# Patient Record
Sex: Male | Born: 1950 | Race: White | Hispanic: No | Marital: Single | Smoking: Former smoker
Health system: Southern US, Community
[De-identification: ages and names within clinical notes are randomized; demographics above are authoritative.]

## PROBLEM LIST (undated history)

## (undated) DIAGNOSIS — J432 Centrilobular emphysema: Secondary | ICD-10-CM

## (undated) DIAGNOSIS — Z87891 Personal history of nicotine dependence: Secondary | ICD-10-CM

## (undated) DIAGNOSIS — Z87442 Personal history of urinary calculi: Secondary | ICD-10-CM

## (undated) DIAGNOSIS — F419 Anxiety disorder, unspecified: Secondary | ICD-10-CM

## (undated) DIAGNOSIS — J449 Chronic obstructive pulmonary disease, unspecified: Secondary | ICD-10-CM

## (undated) HISTORY — PX: DENTAL SURGERY: SHX609

## (undated) HISTORY — DX: Personal history of nicotine dependence: Z87.891

---

## 2006-05-19 ENCOUNTER — Emergency Department (HOSPITAL_COMMUNITY): Admission: EM | Admit: 2006-05-19 | Discharge: 2006-05-19 | Payer: Self-pay | Admitting: Emergency Medicine

## 2012-02-15 ENCOUNTER — Encounter (HOSPITAL_COMMUNITY): Payer: Self-pay | Admitting: Emergency Medicine

## 2012-02-15 ENCOUNTER — Emergency Department (HOSPITAL_COMMUNITY): Payer: Self-pay

## 2012-02-15 ENCOUNTER — Emergency Department (HOSPITAL_COMMUNITY)
Admission: EM | Admit: 2012-02-15 | Discharge: 2012-02-15 | Disposition: A | Discharge status: Home / Self Care | Payer: Self-pay | Attending: Emergency Medicine | Admitting: Emergency Medicine

## 2012-02-15 DIAGNOSIS — N201 Calculus of ureter: Secondary | ICD-10-CM | POA: Insufficient documentation

## 2012-02-15 DIAGNOSIS — R109 Unspecified abdominal pain: Secondary | ICD-10-CM | POA: Insufficient documentation

## 2012-02-15 LAB — URINE MICROSCOPIC-ADD ON

## 2012-02-15 LAB — URINALYSIS, ROUTINE W REFLEX MICROSCOPIC
Bilirubin Urine: NEGATIVE
Glucose, UA: NEGATIVE mg/dL
Ketones, ur: NEGATIVE mg/dL
Leukocytes, UA: NEGATIVE
Nitrite: NEGATIVE
Protein, ur: NEGATIVE mg/dL
Specific Gravity, Urine: 1.026 (ref 1.005–1.030)
Urobilinogen, UA: 0.2 mg/dL (ref 0.0–1.0)
pH: 5.5 (ref 5.0–8.0)

## 2012-02-15 NOTE — ED Provider Notes (Signed)
History     CSN: [id number]  Arrival date & time 02/15/12  7829   First MD Initiated Contact with Patient 02/15/12 [phone]      Chief Complaint  Patient presents with  . Flank Pain    (Consider location/radiation/quality/duration/timing/severity/associated sxs/prior treatment) Patient is a 61 y.o. male presenting with flank pain. The history is provided by the patient.  Flank Pain  He was awakened at 12:30 AM by left mid abdominal pain which was dull and crampy. Pain was moderately severe and he rated it an 8/10. He went to the bathroom and noted that he was having difficulty urinating. There is no dysuria but had a sense that he has to go is difficult to get the stream started. He denies nausea, vomiting, fever, chills. He had a kidney stone 6 years ago and this pain is similar but not as severe. He took over-the-counter ibuprofen which has given him considerable relief in pain is now rated at 5/10.  History reviewed. No pertinent past medical history.  History reviewed. No pertinent past surgical history.  History reviewed. No pertinent family history.  History  Substance Use Topics  . Smoking status: Never Smoker   . Smokeless tobacco: Not on file  . Alcohol Use: No      Review of Systems  Genitourinary: Positive for flank pain.  All other systems reviewed and are negative.    Allergies  Review of patient's allergies indicates no known allergies.  Home Medications   Current Outpatient Rx  Name Route Sig Dispense Refill  . IBUPROFEN 200 MG PO TABS Oral Take 200 mg by mouth every 6 (six) hours as needed. For pain      BP 157/90  Pulse 69  Temp 97.6 F (36.4 C) (Oral)  Resp 18  Ht 6\' 4"  (1.93 m)  Wt 215 lb (97.523 kg)  BMI 26.17 kg/m2  SpO2 97%  Physical Exam  Nursing note and vitals reviewed.  61 year old male who is resting comfortably and in no acute distress. Vital signs are significant for mild hypertension with blood pressure 157/90. Oxygen saturation  is 97% which is normal. Head is normocephalic and atraumatic. PERRLA, EOMI. Neck is nontender and supple. Back is nontender. Lungs are clear without rales, wheezes, rhonchi. Heart has regular rate rhythm without murmur. Abdomen is soft, flat, nontender without masses or hepatosplenomegaly. There is no CVA tenderness. Extremities have full range of motion, no cyanosis or edema. Skin is warm and dry without rash. Neurologic: Mental status is normal, cranial nerves are intact, there are no motor or sensory deficits.  ED Course  Procedures (including critical care time)  Results for orders placed during the hospital encounter of 02/15/12  URINALYSIS, ROUTINE W REFLEX MICROSCOPIC      Component Value Range   Color, Urine YELLOW  YELLOW   APPearance CLEAR  CLEAR   Specific Gravity, Urine 1.026  1.005 - 1.030   pH 5.5  5.0 - 8.0   Glucose, UA NEGATIVE  NEGATIVE mg/dL   Hgb urine dipstick MODERATE (*) NEGATIVE   Bilirubin Urine NEGATIVE  NEGATIVE   Ketones, ur NEGATIVE  NEGATIVE mg/dL   Protein, ur NEGATIVE  NEGATIVE mg/dL   Urobilinogen, UA 0.2  0.0 - 1.0 mg/dL   Nitrite NEGATIVE  NEGATIVE   Leukocytes, UA NEGATIVE  NEGATIVE  URINE MICROSCOPIC-ADD ON      Component Value Range   Squamous Epithelial / LPF RARE  RARE   WBC, UA 0-2  <3 WBC/hpf  RBC / HPF 11-20  <3 RBC/hpf   Ct Abdomen Pelvis Wo Contrast  02/15/2012  *RADIOLOGY REPORT*  Clinical Data: Intermittent left flank pain.  CT ABDOMEN AND PELVIS WITHOUT CONTRAST  Technique:  Multidetector CT imaging of the abdomen and pelvis was performed following the standard protocol without intravenous contrast.  Comparison: 05/19/2006.  Findings: Within the bladder, there is a 4 mm dependent stone. Fullness of the left renal collecting system may be related to this recently passed stone.  Evaluation of solid abdominal viscera is limited by lack of IV contrast.  Taking this limitation into account no focal hepatic, splenic, pancreatic, adrenal or renal  lesion.  No calcified gallstones.  No abdominal aortic aneurysm.  No bowel inflammatory process or free intraperitoneal air. Appendix not clearly visualized.  Scarring/atelectasis lung bases.  Mild degenerative changes lumbar spine without bony destructive lesion.  Noncontrast filled views of the urinary bladder unremarkable with exception of the contained stone.  Prostate gland slightly lobulated.  Interaortocaval lymph node measures 3.6 x 1.7 cm.  This is without change.  Fatty containing left inguinal hernia.  IMPRESSION: Within the bladder, there is a 4 mm dependent stone.  Fullness of the left renal collecting system may be related to this recently passed stone.  Please see above.  Original Report Authenticated By: Fuller Canada, M.D.      1. Ureterolithiasis       MDM  Abdominal pain suspicious for ureteral colic. Urinalysis does show some hematuria. I reviewed his prior ED record and CT scan from 2007 and there were punctate stones present in the kidneys at bedtime in addition to the 4 mm distal right ureteral calculus which he did pass. He will be sent for CT scan to evaluate whether he has another ureteral calculus.   CT scan shows calculus has passed into the bladder. Patient is symptom-free and will be discharged.     Dione Booze, MD 02/15/12 1150

## 2012-02-15 NOTE — ED Notes (Signed)
Pt c/o intermittent left flank pain since 1230 this am. Pt states the pain comes and goes but was severe enough to wake him up.

## 2012-02-15 NOTE — ED Notes (Signed)
Pt refuses IV.   

## 2012-02-15 NOTE — Discharge Instructions (Signed)
Kidney Stones Kidney stones (ureteral lithiasis) are deposits that form inside your kidneys. The intense pain is caused by the stone moving through the urinary tract. When the stone moves, the ureter goes into spasm around the stone. The stone is usually passed in the urine.  CAUSES   A disorder that makes certain neck glands produce too much parathyroid hormone (primary hyperparathyroidism).   A buildup of uric acid crystals.   Narrowing (stricture) of the ureter.   A kidney obstruction present at birth (congenital obstruction).   Previous surgery on the kidney or ureters.   Numerous kidney infections.  SYMPTOMS   Feeling sick to your stomach (nauseous).   Throwing up (vomiting).   Blood in the urine (hematuria).   Pain that usually spreads (radiates) to the groin.   Frequency or urgency of urination.  DIAGNOSIS   Taking a history and physical exam.   Blood or urine tests.   Computerized X-ray scan (CT scan).   Occasionally, an examination of the inside of the urinary bladder (cystoscopy) is performed.  TREATMENT   Observation.   Increasing your fluid intake.   Surgery may be needed if you have severe pain or persistent obstruction.  The size, location, and chemical composition are all important variables that will determine the proper choice of action for you. Talk to your caregiver to better understand your situation so that you will minimize the risk of injury to yourself and your kidney.  HOME CARE INSTRUCTIONS   Drink enough water and fluids to keep your urine clear or pale yellow.   Strain all urine through the provided strainer. Keep all particulate matter and stones for your caregiver to see. The stone causing the pain may be as small as a grain of salt. It is very important to use the strainer each and every time you pass your urine. The collection of your stone will allow your caregiver to analyze it and verify that a stone has actually passed.   Only take  over-the-counter or prescription medicines for pain, discomfort, or fever as directed by your caregiver.   Make a follow-up appointment with your caregiver as directed.   Get follow-up X-rays if required. The absence of pain does not always mean that the stone has passed. It may have only stopped moving. If the urine remains completely obstructed, it can cause loss of kidney function or even complete destruction of the kidney. It is your responsibility to make sure X-rays and follow-ups are completed. Ultrasounds of the kidney can show blockages and the status of the kidney. Ultrasounds are not associated with any radiation and can be performed easily in a matter of minutes.  SEEK IMMEDIATE MEDICAL CARE IF:   Pain cannot be controlled with the prescribed medicine.   You have a fever.   The severity or intensity of pain increases over 18 hours and is not relieved by pain medicine.   You develop a new onset of abdominal pain.   You feel faint or pass out.  MAKE SURE YOU:   Understand these instructions.   Will watch your condition.   Will get help right away if you are not doing well or get worse.  Document Released: 08/18/2005 Document Revised: 08/07/2011 Document Reviewed: 12/14/2009 [facility] Patient Information 2012 [place], [place].  Smoking Hazards Smoking cigarettes is extremely bad for your health. Tobacco smoke has over 200 known poisons in it. There are over 60 chemicals in tobacco smoke that cause cancer. Some of the chemicals found in cigarette  smoke include:   Cyanide.   Benzene.   Formaldehyde.   Methanol (wood alcohol).   Acetylene (fuel used in welding torches).   Ammonia.  Cigarette smoke also contains the poisonous gases nitrogen oxide and carbon monoxide.  Cigarette smokers have an increased risk of many serious medical problems, including:  Lung cancer.   Lung disease (such as pneumonia, bronchitis, and emphysema).   Heart attack and chest pain due to  the heart not getting enough oxygen (angina).   Heart disease and peripheral blood vessel disease.   Hypertension.   Stroke.   Oral cancer (cancer of the lip, mouth, or voice box).   Bladder cancer.   Pancreatic cancer.   Cervical cancer.   Pregnancy complications, including premature birth.   Low birthweight babies.   Early menopause.   Lower estrogen level for women.   Infertility.   Facial wrinkles.   Blindness.   Increased risk of broken bones (fractures).   Senile dementia.   Stillbirths and smaller newborn babies, birth defects, and genetic damage to sperm.   Stomach ulcers and internal bleeding.  Children of smokers have an increased risk of the following, because of secondhand smoke exposure:   Sudden infant death syndrome (SIDS).   Respiratory infections.   Lung cancer.   Heart disease.   Ear infections.  Smoking causes approximately:  90% of all lung cancer deaths in men.   80% of all lung cancer deaths in women.   90% of deaths from chronic obstructive lung disease.  Compared with nonsmokers, smoking increases the risk of:  Coronary heart disease by 2 to 4 times.   Stroke by 2 to 4 times.   Men developing lung cancer by 23 times.   Women developing lung cancer by 13 times.   Dying from chronic obstructive lung diseases by 12 times.  Someone who smokes 2 packs a day loses about 8 years of his or her life. Even smoking lightly shortens your life expectancy by several years. You can greatly reduce the risk of medical problems for you and your family by stopping now. Smoking is the most preventable cause of death and disease in our society. Within days of quitting smoking, your circulation returns to normal, you decrease the risk of having a heart attack, and your lung capacity improves. There may be some increased phlegm in the first few days after quitting, and it may take months for your lungs to clear up completely. Quitting for 10 years  cuts your lung cancer risk to almost that of a nonsmoker. WHY IS SMOKING ADDICTIVE?  Nicotine is the chemical agent in tobacco that is capable of causing addiction or dependence.   When you smoke and inhale, nicotine is absorbed rapidly into the bloodstream through your lungs. Nicotine absorbed through the lungs is capable of creating a powerful addiction. Both inhaled and non-inhaled nicotine may be addictive.   Addiction studies of cigarettes and spit tobacco show that addiction to nicotine occurs mainly during the teen years, when young people begin using tobacco products.  WHAT ARE THE BENEFITS OF QUITTING?  There are many health benefits to quitting smoking.   Likelihood of developing cancer and heart disease decreases. Health improvements are seen almost immediately.   Blood pressure, pulse rate, and breathing patterns start returning to normal soon after quitting.   People who quit may see an improvement in their overall quality of life.  Some people choose to quit all at once. Other options include nicotine replacement products, such  as patches, gum, and nasal sprays. Do not use these products without first checking with your caregiver. QUITTING SMOKING It is not easy to quit smoking. Nicotine is addicting, and longtime habits are hard to change. To start, you can write down all your reasons for quitting, tell your family and friends you want to quit, and ask for their help. Throw your cigarettes away, chew gum or cinnamon sticks, keep your hands busy, and drink extra water or juice. Go for walks and practice deep breathing to relax. Think of all the money you are saving: around $1,000 a year, for the average pack-a-day smoker. Nicotine patches and gum have been shown to improve success at efforts to stop smoking. Zyban (bupropion) is an anti-depressant drug that can be prescribed to reduce nicotine withdrawal symptoms and to suppress the urge to smoke. Smoking is an addiction with both  physical and psychological effects. Joining a stop-smoking support group can help you cope with the emotional issues. For more information and advice on programs to stop smoking, call your doctor, your local hospital, or these organizations:  American Lung Association - 1-800-LUNGUSA   American Cancer Society - 1-800-ACS-2345  Document Released: 09/25/2004 Document Revised: 08/07/2011 Document Reviewed: 05/30/2009 [facility] Patient Information 2012 [place], [place].

## 2014-06-02 ENCOUNTER — Other Ambulatory Visit: Payer: Self-pay | Admitting: Cardiology

## 2014-06-02 DIAGNOSIS — E785 Hyperlipidemia, unspecified: Secondary | ICD-10-CM

## 2014-06-02 DIAGNOSIS — R0789 Other chest pain: Secondary | ICD-10-CM

## 2014-06-09 ENCOUNTER — Other Ambulatory Visit: Payer: Self-pay

## 2014-06-16 ENCOUNTER — Other Ambulatory Visit: Payer: Self-pay

## 2016-06-12 ENCOUNTER — Telehealth: Payer: Self-pay | Admitting: Acute Care

## 2016-06-12 DIAGNOSIS — Z87891 Personal history of nicotine dependence: Secondary | ICD-10-CM

## 2016-06-13 NOTE — Telephone Encounter (Signed)
Called spoke with pt. Scheduled SDMV for 06/30/16 at 9:30am. CT order placed. Pt voiced understanding and had no further questions. Nothing further needed.

## 2016-06-13 NOTE — Telephone Encounter (Signed)
Patient called returning Ashtyn's call to schedule -pr

## 2016-06-30 ENCOUNTER — Ambulatory Visit (INDEPENDENT_AMBULATORY_CARE_PROVIDER_SITE_OTHER): Payer: Commercial Managed Care - HMO | Admitting: Acute Care

## 2016-06-30 ENCOUNTER — Encounter: Payer: Self-pay | Admitting: Acute Care

## 2016-06-30 ENCOUNTER — Ambulatory Visit (INDEPENDENT_AMBULATORY_CARE_PROVIDER_SITE_OTHER)
Admission: RE | Admit: 2016-06-30 | Discharge: 2016-06-30 | Disposition: A | Discharge status: Home / Self Care | Payer: Commercial Managed Care - HMO | Source: Ambulatory Visit | Attending: Acute Care | Admitting: Acute Care

## 2016-06-30 DIAGNOSIS — Z87891 Personal history of nicotine dependence: Secondary | ICD-10-CM

## 2016-06-30 NOTE — Progress Notes (Signed)
Shared Decision Making Visit Lung Cancer Screening Program [phone])   Eligibility:  Age 65 y.o.  Pack Years Smoking History Calculation 45 pack year (# packs/per year x # years smoked)  Recent History of coughing up blood  no  Unexplained weight loss? no ( >Than 15 pounds within the last 6 months )  Prior History Lung / other cancer no (Diagnosis within the last 5 years already requiring surveillance chest CT Scans).  Smoking Status Former Smoker  Former Smokers: Years since quit: < 1 year  Quit Date: 03/01/2016  Visit Components:  Discussion included one or more decision making aids. yes  Discussion included risk/benefits of screening. yes  Discussion included potential follow up diagnostic testing for abnormal scans. yes  Discussion included meaning and risk of over diagnosis. yes  Discussion included meaning and risk of False Positives. yes  Discussion included meaning of total radiation exposure. yes  Counseling Included:  Importance of adherence to annual lung cancer LDCT screening. yes  Impact of comorbidities on ability to participate in the program. yes  Ability and willingness to under diagnostic treatment. yes  Smoking Cessation Counseling:  Current Smokers:   Discussed importance of smoking cessation. NA Former smoker  Information about tobacco cessation classes and interventions provided to patient. yes  Patient provided with "ticket" for LDCT Scan. yes  Symptomatic Patient. no  Counseling NA  Diagnosis Code: Tobacco Use Z72.0  Asymptomatic Patient yes  Counseling NA Former smoker  Former Smokers:   Discussed the importance of maintaining cigarette abstinence. yes  Diagnosis Code: Personal History of Nicotine Dependence. P95.093  Information about tobacco cessation classes and interventions provided to patient. Yes  Patient provided with "ticket" for LDCT Scan. yes  Written Order for Lung Cancer Screening with LDCT placed in Epic.  Yes (CT Chest Lung Cancer Screening Low Dose W/O CM) [id number] Z12.2-Screening of respiratory organs Z87.891-Personal history of nicotine dependence  I spent 20 minutes of face to face time with Mr. Paynter discussing the risks and benefits of lung cancer screening. We viewed a power point together that explained in detail the above noted topics. We took the time to pause the power point at intervals to allow for questions to be asked and answered to ensure understanding. We discussed that he had taken the single most powerful action possible to decrease his risk of developing lung cancer when he quit smoking. I counseled him to remain smoke free, and to contact me if he ever had the desire to smoke again so that I can provide resources and tools to help support the effort to remain smoke free. We discussed the time and location of the scan, and that either [place] or I will call with the results within  24-48 hours of receiving them. He has my card and contact information in the event he needs to speak with me, in addition to a copy of the power point we reviewed as a resource. He verbalized understanding of all of the above and had no further questions upon leaving the office.    Magdalen Spatz, NP 06/30/2016

## 2016-07-01 ENCOUNTER — Telehealth: Payer: Self-pay | Admitting: Acute Care

## 2016-07-01 NOTE — Telephone Encounter (Signed)
Pt is calling for CT results from yesterday.  SG please advise. thanks

## 2016-07-01 NOTE — Telephone Encounter (Signed)
Patient calling back regarding results - pt can be reached at [phone]

## 2016-07-01 NOTE — Telephone Encounter (Signed)
Pt requesting CT results.  SG please advise. Thanks.

## 2016-07-01 NOTE — Telephone Encounter (Signed)
These rtesults have been called to the patient who verbalized understanding. I explained that his scan was read as a Lung RADS 1, negative study: no nodules or definitely benign nodules. Radiology recommendation is for a repeat LDCT in 12 months. I told him we will order and schedule the scan for Oct. 2018. I also told him that his scan indicated aortic atherosclerosis. He is currently not on a statin, but does get annual cholesterol monitoring per his PCP. I will send her the scan results. Isaac Carrillo verbalized understanding of the above.

## 2016-07-02 ENCOUNTER — Other Ambulatory Visit: Payer: Self-pay | Admitting: Acute Care

## 2016-07-02 DIAGNOSIS — Z87891 Personal history of nicotine dependence: Secondary | ICD-10-CM

## 2017-07-10 ENCOUNTER — Ambulatory Visit (INDEPENDENT_AMBULATORY_CARE_PROVIDER_SITE_OTHER)
Admission: RE | Admit: 2017-07-10 | Discharge: 2017-07-10 | Disposition: A | Discharge status: Home / Self Care | Payer: 59 | Source: Ambulatory Visit | Attending: Acute Care | Admitting: Acute Care

## 2017-07-10 DIAGNOSIS — Z87891 Personal history of nicotine dependence: Secondary | ICD-10-CM | POA: Diagnosis not present

## 2017-07-17 ENCOUNTER — Other Ambulatory Visit: Payer: Self-pay | Admitting: Acute Care

## 2017-07-17 DIAGNOSIS — Z122 Encounter for screening for malignant neoplasm of respiratory organs: Secondary | ICD-10-CM

## 2017-07-17 DIAGNOSIS — Z87891 Personal history of nicotine dependence: Secondary | ICD-10-CM

## 2017-08-07 ENCOUNTER — Emergency Department (HOSPITAL_COMMUNITY)
Admission: EM | Admit: 2017-08-07 | Discharge: 2017-08-07 | Disposition: A | Discharge status: Home / Self Care | Payer: Medicare HMO | Attending: Emergency Medicine | Admitting: Emergency Medicine

## 2017-08-07 ENCOUNTER — Other Ambulatory Visit: Payer: Self-pay

## 2017-08-07 ENCOUNTER — Encounter (HOSPITAL_COMMUNITY): Payer: Self-pay | Admitting: Emergency Medicine

## 2017-08-07 DIAGNOSIS — Y998 Other external cause status: Secondary | ICD-10-CM | POA: Insufficient documentation

## 2017-08-07 DIAGNOSIS — Y93H9 Activity, other involving exterior property and land maintenance, building and construction: Secondary | ICD-10-CM | POA: Insufficient documentation

## 2017-08-07 DIAGNOSIS — Z87891 Personal history of nicotine dependence: Secondary | ICD-10-CM | POA: Insufficient documentation

## 2017-08-07 DIAGNOSIS — R51 Headache: Secondary | ICD-10-CM | POA: Diagnosis not present

## 2017-08-07 DIAGNOSIS — S0101XA Laceration without foreign body of scalp, initial encounter: Secondary | ICD-10-CM | POA: Diagnosis not present

## 2017-08-07 DIAGNOSIS — W208XXA Other cause of strike by thrown, projected or falling object, initial encounter: Secondary | ICD-10-CM | POA: Diagnosis not present

## 2017-08-07 DIAGNOSIS — Y929 Unspecified place or not applicable: Secondary | ICD-10-CM | POA: Insufficient documentation

## 2017-08-07 DIAGNOSIS — S0121XA Laceration without foreign body of nose, initial encounter: Secondary | ICD-10-CM | POA: Diagnosis not present

## 2017-08-07 MED ORDER — LIDOCAINE-EPINEPHRINE (PF) 2 %-1:200000 IJ SOLN
10.0000 mL | Freq: Once | INTRAMUSCULAR | Status: AC
  Administered 2017-08-07: 10 mL
  Filled 2017-08-07: qty 20

## 2017-08-07 MED ORDER — CEPHALEXIN 500 MG PO CAPS: 500.0000 mg | ORAL_CAPSULE | Freq: Two times a day (BID) | ORAL | 0 refills | Status: AC

## 2017-08-07 NOTE — ED Notes (Signed)
Patient is A&Ox4.  No signs of distress noted.  Please see providers complete history and physical exam.  

## 2017-08-07 NOTE — ED Triage Notes (Signed)
Pt states he was working with a chainsaw, has laceration to left forehead, and to left nose. Last tetanus shot last year. Pt denies blurred vision. Pt does endorse headache. Not on blood thinners.

## 2017-08-07 NOTE — Discharge Instructions (Signed)
1. Medications: Tylenol or ibuprofen for pain, keflex to prevent infection, usual home medications 2. Treatment: ice for swelling, keep wound clean with warm soap and water and keep bandage dry, do not submerge in water for 24 hours 3. Follow Up: Follow up with your primary care doctor in 7 days to have your stitches removed or sooner if you have concerns. Return to the emergency department for increased redness, drainage of pus from the wound   WOUND CARE  Keep area clean and dry for 24 hours. Do not remove bandage, if applied.  After 24 hours, remove bandage and wash wound gently with mild soap and warm water. Reapply a new bandage after cleaning wound, if directed.   Continue daily cleansing with soap and water until stitches are removed.  Do not apply any ointments or creams to the wound while stitches are in place, as this may cause delayed healing. Return if you experience any of the following signs of infection: Swelling, redness, pus drainage, streaking, fever >101.0 F  Return if you experience excessive bleeding that does not stop after 15-20 minutes of constant, firm pressure.

## 2017-08-07 NOTE — ED Provider Notes (Signed)
[facility] EMERGENCY DEPARTMENT Provider Note   CSN: [id number] Arrival date & time: 08/07/17  1821     History   Chief Complaint Chief Complaint  Patient presents with  . Laceration    HPI Isaac Carrillo is a 66 y.o. male presenting with head laceration.  Pt states he was cutting a tree with a chainsaw, when the tree fell over and hit him on the side of his head.  He denies loss of consciousness.  He is not on blood thinners.  Tetanus updated within the past 3 years.  He had some bleeding from his left forehead and nose, which was difficult to stop.  He went to urgent care, and was told to come to the ER for evaluation for concussion.  He reports a mild headache, has not taken anything for pain.  Pain is constant, nothing makes it better or worse.  He denies vision changes, slurred speech, decreased concentration, nausea, vomiting.  He denies neck or back pain.  Denies injury elsewhere.  HPI  History reviewed. No pertinent past medical history.  There are no active problems to display for this patient.   No past surgical history on file.     Home Medications    Prior to Admission medications   Medication Sig Start Date End Date Taking? Authorizing Provider  cephALEXin (KEFLEX) 500 MG capsule Take 1 capsule (500 mg total) by mouth 2 (two) times daily for 7 days. 08/07/17 08/14/17  Darcie Mellone, PA-C  ibuprofen (ADVIL,MOTRIN) 200 MG tablet Take 200 mg by mouth every 6 (six) hours as needed. For pain    [provider]    Family History No family history on file.  Social History Social History   Tobacco Use  . Smoking status: Former Smoker    Packs/day: 2.50    Years: 40.00    Pack years: 100.00    Types: Cigarettes    Last attempt to quit: 03/01/2016    Years since quitting: 1.4  . Smokeless tobacco: Never Used  . Tobacco comment: Quit smoking 03/2016  Substance Use Topics  . Alcohol use: No  . Drug use: Not on file      Allergies   Patient has no known allergies.   Review of Systems Review of Systems  Skin: Positive for wound.  Neurological: Positive for headaches.  Hematological: Does not bruise/bleed easily.     Physical Exam Updated Vital Signs BP (!) 145/98 (BP Location: Right Arm)   Pulse 75   Temp 98 F (36.7 C) (Oral)   Resp 16   Ht 6\' 4"  (1.93 m)   Wt 108.9 kg (240 lb)   SpO2 97%   BMI 29.21 kg/m   Physical Exam  Constitutional: He is oriented to person, place, and time. He appears well-developed and well-nourished. No distress.  HENT:  Head: Normocephalic.  Right Ear: Tympanic membrane, external ear and ear canal normal.  Left Ear: Tympanic membrane, external ear and ear canal normal.  Nose: Nose normal.  Mouth/Throat: Uvula is midline, oropharynx is clear and moist and mucous membranes are normal.  1.5 cm laceration of the left temple without active bleeding.  Abrasion just superior.  0.5 similar laceration of the bridge of the nose without active bleeding.  No other injuries noted elsewhere.  No tenderness palpation elsewhere of the head.  No obvious hematomas. Nares are patent.  No septal hematoma.  No epistaxis or nasal drainage.  Mouth without signs of injury.  No hemotympanum.  Eyes:  EOM are normal. Pupils are equal, round, and reactive to light.  Neck: Normal range of motion. Neck supple.  Full ROM of head and neck without pain. No TTP of midline c-spine   Cardiovascular: Normal rate, regular rhythm and intact distal pulses.  Pulmonary/Chest: Effort normal and breath sounds normal. He exhibits no tenderness.  Abdominal: Soft. He exhibits no distension. There is no tenderness.  No TTP of the abd  Musculoskeletal: Normal range of motion. He exhibits no edema, tenderness or deformity.  Tenderness to palpation of the neck, back, or midline spine.  Full active range of motion of upper and lower extremities.  Strength equal bilaterally.  Sensation intact bilaterally.   Patient is ambulatory.  Neurological: He is alert and oriented to person, place, and time. He has normal strength. No cranial nerve deficit or sensory deficit. GCS eye subscore is 4. GCS verbal subscore is 5. GCS motor subscore is 6.  Fine movement and coordination intact.  Performed nose to finger without difficulty  Skin: Skin is warm.  Psychiatric: He has a normal mood and affect.  Nursing note and vitals reviewed.    ED Treatments / Results  Labs (all labs ordered are listed, but only abnormal results are displayed) Labs Reviewed - No data to display  EKG  EKG Interpretation None       Radiology No results found.  Procedures .Marland KitchenLaceration Repair Date/Time: 08/07/2017 10:25 PM Performed by: Franchot Heidelberg, PA-C Authorized by: Franchot Heidelberg, PA-C   Consent:    Consent obtained:  Verbal   Consent given by:  Patient   Risks discussed:  Pain, poor cosmetic result, poor wound healing and infection Anesthesia (see MAR for exact dosages):    Anesthesia method:  Local infiltration   Local anesthetic:  Lidocaine 2% WITH epi Laceration details:    Location:  Scalp   Scalp location:  L temporal   Length (cm):  1.5   Depth (mm):  2 Repair type:    Repair type:  Simple Pre-procedure details:    Preparation:  Patient was prepped and draped in usual sterile fashion Exploration:    Wound exploration: wound explored through full range of motion and entire depth of wound probed and visualized     Wound extent: no muscle damage noted, no nerve damage noted, no tendon damage noted and no vascular damage noted   Treatment:    Area cleansed with:  Betadine   Amount of cleaning:  Standard Skin repair:    Repair method:  Sutures   Suture size:  6-0   Suture material:  Prolene   Suture technique:  Simple interrupted   Number of sutures:  2 Approximation:    Approximation:  Close Post-procedure details:    Dressing:  Sterile dressing   Patient tolerance of procedure:   Tolerated well, no immediate complications .Marland KitchenLaceration Repair Date/Time: 08/07/2017 10:25 PM Performed by: Franchot Heidelberg, PA-C Authorized by: Franchot Heidelberg, PA-C   Consent:    Consent obtained:  Verbal   Consent given by:  Patient   Risks discussed:  Pain, poor cosmetic result, poor wound healing and infection Anesthesia (see MAR for exact dosages):    Anesthesia method:  None Laceration details:    Location:  Face   Face location:  Nose   Length (cm):  0.5   Depth (mm):  1 Repair type:    Repair type:  Simple Treatment:    Area cleansed with:  Betadine Skin repair:    Repair method:  Tissue adhesive Approximation:  Approximation:  Close Post-procedure details:    Dressing:  Open (no dressing)   Patient tolerance of procedure:  Tolerated well, no immediate complications   (including critical care time)  Medications Ordered in ED Medications  lidocaine-EPINEPHrine (XYLOCAINE W/EPI) 2 %-1:200000 (PF) injection 10 mL (10 mLs Infiltration Given 08/07/17 2123)     Initial Impression / Assessment and Plan / ED Course  I have reviewed the triage vital signs and the nursing notes.  Pertinent labs & imaging results that were available during my care of the patient were reviewed by me and considered in my medical decision making (see chart for details).     Patient presenting for evaluation after being hit in head with a tree branch.  Has laceration of the left temple and bridge of the nose.  Physical exam reassuring, no obvious neurologic deficits.  Discussed option of CT scan of head and neck, patient states he does not want to do that today.  Left temple laceration repaired with sutures, and nasal laceration with Dermabond.  Patient to be given antibiotics.  Aftercare instructions given.  Strict return precautions given including vision changes, nausea, worsening headache, numbness, or other signs of head injury.  Patient to follow-up with primary care for suture  removal.  At this time, patient appears safe for discharge.  Return precautions given.  Patient states he understands and agrees to plan.   Final Clinical Impressions(s) / ED Diagnoses   Final diagnoses:  Laceration of scalp without foreign body, initial encounter    ED Discharge Orders        Ordered    cephALEXin (KEFLEX) 500 MG capsule  2 times daily     08/07/17 2217       Franchot Heidelberg, PA-C 08/08/17 0201    Milton Ferguson, MD 08/08/17 1554

## 2017-08-07 NOTE — ED Notes (Signed)
Pt remains in waiting room. Updated on wait for treatment room. 

## 2017-08-18 DIAGNOSIS — S0181XA Laceration without foreign body of other part of head, initial encounter: Secondary | ICD-10-CM | POA: Diagnosis not present

## 2017-09-23 DIAGNOSIS — Z23 Encounter for immunization: Secondary | ICD-10-CM | POA: Diagnosis not present

## 2017-09-23 DIAGNOSIS — I2510 Atherosclerotic heart disease of native coronary artery without angina pectoris: Secondary | ICD-10-CM | POA: Diagnosis not present

## 2017-09-23 DIAGNOSIS — R030 Elevated blood-pressure reading, without diagnosis of hypertension: Secondary | ICD-10-CM | POA: Diagnosis not present

## 2017-09-23 DIAGNOSIS — J432 Centrilobular emphysema: Secondary | ICD-10-CM | POA: Diagnosis not present

## 2017-09-23 DIAGNOSIS — Z136 Encounter for screening for cardiovascular disorders: Secondary | ICD-10-CM | POA: Diagnosis not present

## 2017-09-23 DIAGNOSIS — R69 Illness, unspecified: Secondary | ICD-10-CM | POA: Diagnosis not present

## 2017-09-23 DIAGNOSIS — Z1211 Encounter for screening for malignant neoplasm of colon: Secondary | ICD-10-CM | POA: Diagnosis not present

## 2017-09-23 DIAGNOSIS — E669 Obesity, unspecified: Secondary | ICD-10-CM | POA: Diagnosis not present

## 2017-09-23 DIAGNOSIS — E781 Pure hyperglyceridemia: Secondary | ICD-10-CM | POA: Diagnosis not present

## 2017-09-23 DIAGNOSIS — Z0000 Encounter for general adult medical examination without abnormal findings: Secondary | ICD-10-CM | POA: Diagnosis not present

## 2017-09-24 ENCOUNTER — Other Ambulatory Visit: Payer: Self-pay | Admitting: Family Medicine

## 2017-09-24 DIAGNOSIS — Z136 Encounter for screening for cardiovascular disorders: Secondary | ICD-10-CM

## 2017-10-02 ENCOUNTER — Inpatient Hospital Stay
Admission: RE | Admit: 2017-10-02 | Discharge: 2017-10-02 | Disposition: A | Discharge status: Home / Self Care | Payer: 59 | Source: Ambulatory Visit | Attending: Family Medicine | Admitting: Family Medicine

## 2017-11-02 ENCOUNTER — Ambulatory Visit
Admission: RE | Admit: 2017-11-02 | Discharge: 2017-11-02 | Disposition: A | Discharge status: Home / Self Care | Payer: Commercial Managed Care - HMO | Source: Ambulatory Visit | Attending: Family Medicine | Admitting: Family Medicine

## 2017-11-02 DIAGNOSIS — Z136 Encounter for screening for cardiovascular disorders: Secondary | ICD-10-CM

## 2017-11-02 DIAGNOSIS — Z87891 Personal history of nicotine dependence: Secondary | ICD-10-CM | POA: Diagnosis not present

## 2018-02-16 DIAGNOSIS — L0390 Cellulitis, unspecified: Secondary | ICD-10-CM | POA: Diagnosis not present

## 2018-02-16 DIAGNOSIS — L03012 Cellulitis of left finger: Secondary | ICD-10-CM | POA: Diagnosis not present

## 2018-02-17 NOTE — ED Notes (Signed)
Patient to desk about paperwork he needed from this visit filled out for worker's compensation.  Spoke to PA on duty who stAtes to direct patient to Occupational Health.  Assisted patient with this.

## 2018-02-18 DIAGNOSIS — L0291 Cutaneous abscess, unspecified: Secondary | ICD-10-CM | POA: Diagnosis not present

## 2018-02-18 DIAGNOSIS — L02512 Cutaneous abscess of left hand: Secondary | ICD-10-CM | POA: Diagnosis not present

## 2018-04-12 DIAGNOSIS — L0291 Cutaneous abscess, unspecified: Secondary | ICD-10-CM | POA: Diagnosis not present

## 2018-04-12 DIAGNOSIS — E781 Pure hyperglyceridemia: Secondary | ICD-10-CM | POA: Diagnosis not present

## 2018-04-13 DIAGNOSIS — R69 Illness, unspecified: Secondary | ICD-10-CM | POA: Diagnosis not present

## 2018-04-15 DIAGNOSIS — R69 Illness, unspecified: Secondary | ICD-10-CM | POA: Diagnosis not present

## 2018-06-14 DIAGNOSIS — R51 Headache: Secondary | ICD-10-CM | POA: Diagnosis not present

## 2018-06-14 DIAGNOSIS — R42 Dizziness and giddiness: Secondary | ICD-10-CM | POA: Diagnosis not present

## 2018-06-17 ENCOUNTER — Other Ambulatory Visit: Payer: Self-pay | Admitting: Family Medicine

## 2018-06-17 DIAGNOSIS — R519 Headache, unspecified: Secondary | ICD-10-CM

## 2018-06-17 DIAGNOSIS — R51 Headache: Secondary | ICD-10-CM

## 2018-06-17 DIAGNOSIS — R42 Dizziness and giddiness: Secondary | ICD-10-CM

## 2018-06-22 ENCOUNTER — Ambulatory Visit
Admission: RE | Admit: 2018-06-22 | Discharge: 2018-06-22 | Disposition: A | Discharge status: Home / Self Care | Payer: Medicare HMO | Source: Ambulatory Visit | Attending: Family Medicine | Admitting: Family Medicine

## 2018-06-22 DIAGNOSIS — D352 Benign neoplasm of pituitary gland: Secondary | ICD-10-CM | POA: Diagnosis not present

## 2018-06-22 DIAGNOSIS — R519 Headache, unspecified: Secondary | ICD-10-CM

## 2018-06-22 DIAGNOSIS — R42 Dizziness and giddiness: Secondary | ICD-10-CM

## 2018-06-22 DIAGNOSIS — R51 Headache: Secondary | ICD-10-CM

## 2018-06-22 MED ORDER — GADOBENATE DIMEGLUMINE 529 MG/ML IV SOLN
20.0000 mL | Freq: Once | INTRAVENOUS | Status: AC | PRN
  Administered 2018-06-22: 20 mL via INTRAVENOUS

## 2018-07-12 ENCOUNTER — Ambulatory Visit (INDEPENDENT_AMBULATORY_CARE_PROVIDER_SITE_OTHER)
Admission: RE | Admit: 2018-07-12 | Discharge: 2018-07-12 | Disposition: A | Discharge status: Home / Self Care | Payer: Medicare HMO | Source: Ambulatory Visit | Attending: Acute Care | Admitting: Acute Care

## 2018-07-12 DIAGNOSIS — Z122 Encounter for screening for malignant neoplasm of respiratory organs: Secondary | ICD-10-CM

## 2018-07-12 DIAGNOSIS — Z87891 Personal history of nicotine dependence: Secondary | ICD-10-CM

## 2018-07-15 ENCOUNTER — Other Ambulatory Visit: Payer: Self-pay | Admitting: Acute Care

## 2018-07-15 DIAGNOSIS — Z122 Encounter for screening for malignant neoplasm of respiratory organs: Secondary | ICD-10-CM

## 2018-07-15 DIAGNOSIS — Z87891 Personal history of nicotine dependence: Secondary | ICD-10-CM

## 2018-07-26 DIAGNOSIS — Z0100 Encounter for examination of eyes and vision without abnormal findings: Secondary | ICD-10-CM | POA: Diagnosis not present

## 2018-08-04 DIAGNOSIS — Z0100 Encounter for examination of eyes and vision without abnormal findings: Secondary | ICD-10-CM | POA: Diagnosis not present

## 2018-09-11 DIAGNOSIS — Z0101 Encounter for examination of eyes and vision with abnormal findings: Secondary | ICD-10-CM | POA: Diagnosis not present

## 2018-09-30 DIAGNOSIS — Z23 Encounter for immunization: Secondary | ICD-10-CM | POA: Diagnosis not present

## 2018-09-30 DIAGNOSIS — R69 Illness, unspecified: Secondary | ICD-10-CM | POA: Diagnosis not present

## 2018-09-30 DIAGNOSIS — E781 Pure hyperglyceridemia: Secondary | ICD-10-CM | POA: Diagnosis not present

## 2018-09-30 DIAGNOSIS — Z1211 Encounter for screening for malignant neoplasm of colon: Secondary | ICD-10-CM | POA: Diagnosis not present

## 2018-09-30 DIAGNOSIS — Z0000 Encounter for general adult medical examination without abnormal findings: Secondary | ICD-10-CM | POA: Diagnosis not present

## 2018-09-30 DIAGNOSIS — I2510 Atherosclerotic heart disease of native coronary artery without angina pectoris: Secondary | ICD-10-CM | POA: Diagnosis not present

## 2018-11-23 DIAGNOSIS — R05 Cough: Secondary | ICD-10-CM | POA: Diagnosis not present

## 2019-03-10 DIAGNOSIS — R5383 Other fatigue: Secondary | ICD-10-CM | POA: Diagnosis not present

## 2019-03-14 DIAGNOSIS — R5383 Other fatigue: Secondary | ICD-10-CM | POA: Diagnosis not present

## 2019-08-04 ENCOUNTER — Other Ambulatory Visit: Payer: Self-pay

## 2019-08-04 ENCOUNTER — Ambulatory Visit (INDEPENDENT_AMBULATORY_CARE_PROVIDER_SITE_OTHER)
Admission: RE | Admit: 2019-08-04 | Discharge: 2019-08-04 | Disposition: A | Discharge status: Home / Self Care | Payer: Medicare HMO | Source: Ambulatory Visit | Attending: Acute Care | Admitting: Acute Care

## 2019-08-04 DIAGNOSIS — Z122 Encounter for screening for malignant neoplasm of respiratory organs: Secondary | ICD-10-CM

## 2019-08-04 DIAGNOSIS — Z87891 Personal history of nicotine dependence: Secondary | ICD-10-CM

## 2019-08-10 ENCOUNTER — Other Ambulatory Visit: Payer: Self-pay | Admitting: *Deleted

## 2019-08-10 DIAGNOSIS — Z122 Encounter for screening for malignant neoplasm of respiratory organs: Secondary | ICD-10-CM

## 2019-08-10 DIAGNOSIS — Z87891 Personal history of nicotine dependence: Secondary | ICD-10-CM

## 2019-09-06 DIAGNOSIS — H01002 Unspecified blepharitis right lower eyelid: Secondary | ICD-10-CM | POA: Diagnosis not present

## 2019-10-18 DIAGNOSIS — Z13220 Encounter for screening for lipoid disorders: Secondary | ICD-10-CM | POA: Diagnosis not present

## 2019-10-18 DIAGNOSIS — Z0000 Encounter for general adult medical examination without abnormal findings: Secondary | ICD-10-CM | POA: Diagnosis not present

## 2019-10-18 DIAGNOSIS — R69 Illness, unspecified: Secondary | ICD-10-CM | POA: Diagnosis not present

## 2019-10-18 DIAGNOSIS — M25571 Pain in right ankle and joints of right foot: Secondary | ICD-10-CM | POA: Diagnosis not present

## 2019-10-18 DIAGNOSIS — I2510 Atherosclerotic heart disease of native coronary artery without angina pectoris: Secondary | ICD-10-CM | POA: Diagnosis not present

## 2019-10-18 DIAGNOSIS — E781 Pure hyperglyceridemia: Secondary | ICD-10-CM | POA: Diagnosis not present

## 2019-10-18 DIAGNOSIS — H0012 Chalazion right lower eyelid: Secondary | ICD-10-CM | POA: Diagnosis not present

## 2019-10-18 DIAGNOSIS — N529 Male erectile dysfunction, unspecified: Secondary | ICD-10-CM | POA: Diagnosis not present

## 2019-10-18 DIAGNOSIS — Z136 Encounter for screening for cardiovascular disorders: Secondary | ICD-10-CM | POA: Diagnosis not present

## 2019-10-27 ENCOUNTER — Other Ambulatory Visit: Payer: Self-pay

## 2019-10-27 ENCOUNTER — Ambulatory Visit: Payer: Medicare HMO | Admitting: Orthopaedic Surgery

## 2019-10-27 ENCOUNTER — Encounter: Payer: Self-pay | Admitting: Orthopaedic Surgery

## 2019-10-27 ENCOUNTER — Ambulatory Visit (INDEPENDENT_AMBULATORY_CARE_PROVIDER_SITE_OTHER): Payer: Medicare HMO

## 2019-10-27 DIAGNOSIS — M25571 Pain in right ankle and joints of right foot: Secondary | ICD-10-CM | POA: Diagnosis not present

## 2019-10-27 NOTE — Progress Notes (Signed)
Office Visit Note   Patient: Isaac Carrillo           Date of Birth: 05-04-1951           MRN: [id number] Visit Date: 10/27/2019              Requested by: Isaac Stalker, PA-C [place],  [place] [address] PCP: Isaac Stalker, PA-C   Assessment & Plan: Visit Diagnoses:  1. Pain in right ankle and joints of right foot     Plan: Recommend MRI of the right ankle rule out peroneal tendon tear having follow-up after the MRI if no results discuss further treatment.  Would not recommend injection in this area.  He has tried conservative treatment which included time and bracing.  Follow-Up Instructions: Return After MRI.   Orders:  Orders Placed This Encounter  Procedures  . XR Ankle Complete Right   No orders of the defined types were placed in this encounter.     Procedures: No procedures performed   Clinical Data: No additional findings.   Subjective: Chief Complaint  Patient presents with  . Right Ankle - Pain    HPI Isaac Carrillo is a 69 year old male were seen for the first time for right ankle pain.  Pain spent ongoing since 2017 however it is becoming more persistent.  He notes that his primary care physician gave him an ASO brace in 2019 he is to wear this for a few days in pain will get better now is not helping his and no other treatment.  He denies any injury.  He denies any mechanical symptoms ankle.  Denies any snapping-like sensation over the lateral aspect of the right ankle.  Pain score lateral ankle region.  He feels that he has some swelling lateral aspect of his ankle also.  Review of Systems Negative for fevers chills shortness of breath or chest pain  Objective: Vital Signs: There were no vitals taken for this visit.  Physical Exam Constitutional:      Appearance: He is normal weight. He is not ill-appearing or diaphoretic.  Cardiovascular:     Pulses: Normal pulses.  Pulmonary:     Effort: Pulmonary effort is normal.    Neurological:     Mental Status: He is alert and oriented to person, place, and time.  Psychiatric:        Mood and Affect: Mood normal.     Ortho Exam Right ankle full dorsiflexion plantarflexion.  5 out of 5 strength with inversion eversion of the foot against resistance bilaterally.  Prominence just distal and posterior to the lateral malleolus.  Tenderness over the peroneal tendon just distal to the lateral malleolus.  Bilateral Achilles nontender.  Nontender over the posterior tibial tendons bilaterally.  Provocative testing for right peroneal tendon subluxation is negative. Specialty Comments:  No specialty comments available.  Imaging: XR Ankle Complete Right  Result Date: 10/27/2019 Right ankle 3 views: No acute fractures.  Talus well located within the ankle mortise diastases.  No bony abnormalities otherwise.    PMFS History: There are no problems to display for this patient.  History reviewed. No pertinent past medical history.  History reviewed. No pertinent family history.  History reviewed. No pertinent surgical history. Social History   Occupational History  . Not on file  Tobacco Use  . Smoking status: Former Smoker    Packs/day: 2.50    Years: 40.00    Pack years: 100.00    Types: Cigarettes  Quit date: 03/01/2016    Years since quitting: 3.6  . Smokeless tobacco: Never Used  . Tobacco comment: Quit smoking 03/2016  Substance and Sexual Activity  . Alcohol use: No  . Drug use: Not on file  . Sexual activity: Not on file

## 2019-11-02 DIAGNOSIS — H0012 Chalazion right lower eyelid: Secondary | ICD-10-CM | POA: Diagnosis not present

## 2019-11-02 DIAGNOSIS — H2513 Age-related nuclear cataract, bilateral: Secondary | ICD-10-CM | POA: Diagnosis not present

## 2019-11-02 DIAGNOSIS — H0100B Unspecified blepharitis left eye, upper and lower eyelids: Secondary | ICD-10-CM | POA: Diagnosis not present

## 2019-11-02 DIAGNOSIS — H0100A Unspecified blepharitis right eye, upper and lower eyelids: Secondary | ICD-10-CM | POA: Diagnosis not present

## 2019-11-29 DIAGNOSIS — D229 Melanocytic nevi, unspecified: Secondary | ICD-10-CM | POA: Diagnosis not present

## 2019-11-29 DIAGNOSIS — R69 Illness, unspecified: Secondary | ICD-10-CM | POA: Diagnosis not present

## 2019-11-29 DIAGNOSIS — E781 Pure hyperglyceridemia: Secondary | ICD-10-CM | POA: Diagnosis not present

## 2019-12-05 ENCOUNTER — Other Ambulatory Visit: Payer: Self-pay | Admitting: Orthopaedic Surgery

## 2019-12-05 ENCOUNTER — Telehealth: Payer: Self-pay | Admitting: *Deleted

## 2019-12-05 DIAGNOSIS — G8929 Other chronic pain: Secondary | ICD-10-CM

## 2019-12-05 NOTE — Telephone Encounter (Signed)
Pt called left vm stating he was in to see Dr. Ninfa Linden back in Feb and he was going to place a referral for MRI Right ankle, pt states he has not heard from anyone regarding getting set up for appt. I checked and no order was placed I went ahead and placed the order and called back to inform him I placed it as URGENT and sent it to [facility] so he can get scheduled sooner. Advised pt to return my call if needed.

## 2019-12-10 ENCOUNTER — Other Ambulatory Visit: Payer: Self-pay

## 2019-12-10 ENCOUNTER — Ambulatory Visit (HOSPITAL_BASED_OUTPATIENT_CLINIC_OR_DEPARTMENT_OTHER)
Admission: RE | Admit: 2019-12-10 | Discharge: 2019-12-10 | Disposition: A | Discharge status: Home / Self Care | Payer: Medicare HMO | Source: Ambulatory Visit | Attending: Orthopaedic Surgery | Admitting: Orthopaedic Surgery

## 2019-12-10 DIAGNOSIS — R600 Localized edema: Secondary | ICD-10-CM | POA: Diagnosis not present

## 2019-12-10 DIAGNOSIS — M25571 Pain in right ankle and joints of right foot: Secondary | ICD-10-CM | POA: Insufficient documentation

## 2019-12-10 DIAGNOSIS — S86311A Strain of muscle(s) and tendon(s) of peroneal muscle group at lower leg level, right leg, initial encounter: Secondary | ICD-10-CM | POA: Diagnosis not present

## 2019-12-10 DIAGNOSIS — G8929 Other chronic pain: Secondary | ICD-10-CM | POA: Insufficient documentation

## 2019-12-13 ENCOUNTER — Ambulatory Visit: Payer: Medicare HMO | Admitting: Orthopaedic Surgery

## 2019-12-13 ENCOUNTER — Encounter: Payer: Self-pay | Admitting: Orthopaedic Surgery

## 2019-12-13 ENCOUNTER — Other Ambulatory Visit: Payer: Self-pay

## 2019-12-13 DIAGNOSIS — S86312D Strain of muscle(s) and tendon(s) of peroneal muscle group at lower leg level, left leg, subsequent encounter: Secondary | ICD-10-CM

## 2019-12-13 NOTE — Progress Notes (Signed)
Patient is very pleasant and active 69 year old gentleman who is returning for follow-up after having a MRI of his right ankle.  I was concerned about the possibility of peroneal tendon tears.  His pain has been persistent with activities along the lateral aspect of his foot between the lateral malleolus and the base the fifth metatarsal.  He has tried and failed other conservative treatment measures so we sent him for this MRI.  On exam today there is no swelling along the lateral aspect of his right ankle but he is very tender to palpation along the course of the peroneal tendons.  They do not subluxate.  MRI is reviewed with him and it does show significant tenosynovitis of the peroneal tendons.  There is an interstitial tear of the peroneus longus tendon and there is tendinosis with a long segment high-grade split tear of the peroneus brevis tendon.  There is edema coursing along this area as well which is all retromalleolar.  At this point given the MRI and clinical exam findings of his right ankle showing the tears of the peroneal tendons, I would like to send him to Dr. Sharol Given for further evaluation and treatment of this injury.  He agrees with this referral as well.

## 2019-12-15 ENCOUNTER — Other Ambulatory Visit: Payer: Self-pay

## 2019-12-15 ENCOUNTER — Ambulatory Visit: Payer: Medicare HMO | Admitting: Orthopedic Surgery

## 2019-12-15 DIAGNOSIS — S86311A Strain of muscle(s) and tendon(s) of peroneal muscle group at lower leg level, right leg, initial encounter: Secondary | ICD-10-CM

## 2019-12-16 ENCOUNTER — Encounter: Payer: Self-pay | Admitting: Orthopedic Surgery

## 2019-12-16 NOTE — Progress Notes (Signed)
   Office Visit Note   Patient: Isaac Carrillo           Date of Birth: 1950/12/23           MRN: [id number] Visit Date: 12/15/2019              Requested by: Marda Stalker, PA-C [place],  [place] [address] PCP: Marda Stalker, PA-C  Chief Complaint  Patient presents with  . Right Ankle - Follow-up      HPI: Patient is a 69 year old gentleman who was seen for initial evaluation referral from Dr. Ninfa Linden for tearing of the peroneus longus and peroneus brevis right ankle.  Patient does report pain with activities of daily living.  Assessment & Plan: Visit Diagnoses:  1. Peroneal tendon tear, right, initial encounter     Plan: Discussed operative versus nonoperative treatment.  Operative treatment would include debridement of the peroneus longus and peroneus brevis and reinforcement of the peroneus brevis with the peroneus longus.  Patient states he would like to proceed with conservative therapy at this time he will use his ankle stabilizing orthosis and will call if he wants to proceed with surgery.  Follow-Up Instructions: Return if symptoms worsen or fail to improve.   Ortho Exam  Patient is alert, oriented, no adenopathy, well-dressed, normal affect, normal respiratory effort. Examination patient has good pulses he has good ankle good subtalar motion he does have a high arch with a cavus foot in a plantarflexed first ray this places the hindfoot in varus and does overload the peroneal tendons.  The peroneal tendons are tender to palpation.  Patient has pain with resisted eversion pain and swelling over the peroneal tendons.  Review of the MRI scan shows degenerative tearing of both the peroneus longus and peroneus brevis.  Imaging: No results found. No images are attached to the encounter.  Labs: No results found for: HGBA1C, ESRSEDRATE, CRP, LABURIC, REPTSTATUS, GRAMSTAIN, CULT, LABORGA   No results found for: ALBUMIN, PREALBUMIN, LABURIC  No  results found for: MG No results found for: VD25OH  No results found for: PREALBUMIN No flowsheet data found.   There is no height or weight on file to calculate BMI.  Orders:  No orders of the defined types were placed in this encounter.  No orders of the defined types were placed in this encounter.    Procedures: No procedures performed  Clinical Data: No additional findings.  ROS:  All other systems negative, except as noted in the HPI. Review of Systems  Objective: Vital Signs: There were no vitals taken for this visit.  Specialty Comments:  No specialty comments available.  PMFS History: There are no problems to display for this patient.  History reviewed. No pertinent past medical history.  History reviewed. No pertinent family history.  History reviewed. No pertinent surgical history. Social History   Occupational History  . Not on file  Tobacco Use  . Smoking status: Former Smoker    Packs/day: 2.50    Years: 40.00    Pack years: 100.00    Types: Cigarettes    Quit date: 03/01/2016    Years since quitting: 3.7  . Smokeless tobacco: Never Used  . Tobacco comment: Quit smoking 03/2016  Substance and Sexual Activity  . Alcohol use: No  . Drug use: Not on file  . Sexual activity: Not on file

## 2020-02-02 DIAGNOSIS — H44002 Unspecified purulent endophthalmitis, left eye: Secondary | ICD-10-CM | POA: Diagnosis not present

## 2020-02-03 DIAGNOSIS — Z87891 Personal history of nicotine dependence: Secondary | ICD-10-CM | POA: Diagnosis not present

## 2020-02-03 DIAGNOSIS — Z8249 Family history of ischemic heart disease and other diseases of the circulatory system: Secondary | ICD-10-CM | POA: Diagnosis not present

## 2020-02-03 DIAGNOSIS — L089 Local infection of the skin and subcutaneous tissue, unspecified: Secondary | ICD-10-CM | POA: Diagnosis not present

## 2020-02-03 DIAGNOSIS — F419 Anxiety disorder, unspecified: Secondary | ICD-10-CM | POA: Diagnosis not present

## 2020-02-03 DIAGNOSIS — Z604 Social exclusion and rejection: Secondary | ICD-10-CM | POA: Diagnosis not present

## 2020-02-03 DIAGNOSIS — K5900 Constipation, unspecified: Secondary | ICD-10-CM | POA: Diagnosis not present

## 2020-02-03 DIAGNOSIS — E785 Hyperlipidemia, unspecified: Secondary | ICD-10-CM | POA: Diagnosis not present

## 2020-02-03 DIAGNOSIS — R69 Illness, unspecified: Secondary | ICD-10-CM | POA: Diagnosis not present

## 2020-02-03 DIAGNOSIS — R030 Elevated blood-pressure reading, without diagnosis of hypertension: Secondary | ICD-10-CM | POA: Diagnosis not present

## 2020-02-03 DIAGNOSIS — Z008 Encounter for other general examination: Secondary | ICD-10-CM | POA: Diagnosis not present

## 2020-02-03 DIAGNOSIS — Z7982 Long term (current) use of aspirin: Secondary | ICD-10-CM | POA: Diagnosis not present

## 2020-03-29 DIAGNOSIS — R21 Rash and other nonspecific skin eruption: Secondary | ICD-10-CM | POA: Diagnosis not present

## 2020-03-29 DIAGNOSIS — R350 Frequency of micturition: Secondary | ICD-10-CM | POA: Diagnosis not present

## 2020-03-29 DIAGNOSIS — D229 Melanocytic nevi, unspecified: Secondary | ICD-10-CM | POA: Diagnosis not present

## 2020-04-19 DIAGNOSIS — H9209 Otalgia, unspecified ear: Secondary | ICD-10-CM | POA: Diagnosis not present

## 2020-08-06 ENCOUNTER — Ambulatory Visit (INDEPENDENT_AMBULATORY_CARE_PROVIDER_SITE_OTHER)
Admission: RE | Admit: 2020-08-06 | Discharge: 2020-08-06 | Disposition: A | Discharge status: Home / Self Care | Payer: Medicare HMO | Source: Ambulatory Visit | Attending: Family Medicine | Admitting: Family Medicine

## 2020-08-06 ENCOUNTER — Other Ambulatory Visit: Payer: Self-pay

## 2020-08-06 DIAGNOSIS — Z122 Encounter for screening for malignant neoplasm of respiratory organs: Secondary | ICD-10-CM

## 2020-08-06 DIAGNOSIS — Z87891 Personal history of nicotine dependence: Secondary | ICD-10-CM

## 2020-08-07 ENCOUNTER — Telehealth: Payer: Self-pay

## 2020-08-07 NOTE — Telephone Encounter (Signed)
Received call report on 12/6 LDCT.  Impression copied below, full report available in Epic:   IMPRESSION: 1. Slow progressive enlargement of a mixed sub solid and solid lesion in the posterior aspect of the left lower lobe, highly concerning for possible primary bronchogenic adenocarcinoma, categorized as Lung-RADS 4BS, suspicious. Additional imaging evaluation or consultation with Pulmonology or Thoracic Surgery recommended. 2. The "S" modifier above refers to potentially clinically significant non lung cancer related findings. Specifically, there is aortic atherosclerosis, in addition to left main and 3 vessel coronary artery disease. Please note that although the presence of coronary artery calcium documents the presence of coronary artery disease, the severity of this disease and any potential stenosis cannot be assessed on this non-gated CT examination. Assessment for potential risk factor modification, dietary therapy or pharmacologic therapy may be warranted, if clinically indicated. 3. Mild diffuse bronchial wall thickening with mild centrilobular and paraseptal emphysema; imaging findings suggestive of underlying COPD.  SG please advise, thanks!

## 2020-08-07 NOTE — Telephone Encounter (Signed)
We will call this through the screening program. Thanks so much

## 2020-08-09 NOTE — Progress Notes (Signed)
I have called the patient with the results of his low dose CT. I explained that there is an area in the left lower lobe that is progressively getting larger. I explained that we are concerned and that I would like him to come into the office to be seen by Dr. Valeta Harms on 08/15/2020 at 10:30. He verbalized understanding and has agreed to the appointment . Patient has the office contact number if he has any further questions.  Langley Gauss, place on tickle list so we can evaluate need to be returned to the lung cancer screening program once the nodule has been evaluated. Also, please fax results to PCP, and let them know we have follow up scheduled with Dr. Valeta Harms 12/15. Thanks so much

## 2020-08-15 ENCOUNTER — Telehealth: Payer: Self-pay | Admitting: Pulmonary Disease

## 2020-08-15 ENCOUNTER — Other Ambulatory Visit: Payer: Self-pay

## 2020-08-15 ENCOUNTER — Ambulatory Visit: Payer: Medicare HMO | Admitting: Pulmonary Disease

## 2020-08-15 ENCOUNTER — Encounter: Payer: Self-pay | Admitting: Pulmonary Disease

## 2020-08-15 VITALS — BP 132/68 | HR 84 | Temp 98.0°F | Ht 76.0 in | Wt 232.4 lb

## 2020-08-15 DIAGNOSIS — R69 Illness, unspecified: Secondary | ICD-10-CM | POA: Diagnosis not present

## 2020-08-15 DIAGNOSIS — R911 Solitary pulmonary nodule: Secondary | ICD-10-CM | POA: Diagnosis not present

## 2020-08-15 DIAGNOSIS — Z720 Tobacco use: Secondary | ICD-10-CM

## 2020-08-15 DIAGNOSIS — F418 Other specified anxiety disorders: Secondary | ICD-10-CM | POA: Diagnosis not present

## 2020-08-15 DIAGNOSIS — R918 Other nonspecific abnormal finding of lung field: Secondary | ICD-10-CM

## 2020-08-15 DIAGNOSIS — Z87891 Personal history of nicotine dependence: Secondary | ICD-10-CM | POA: Diagnosis not present

## 2020-08-15 NOTE — Telephone Encounter (Signed)
I have scheduled the pt for his CT.  It is scheduled for 12/21 at 10:45, arrive 10:30.  I had to leave vm for pt as well.

## 2020-08-15 NOTE — H&P (View-Only) (Signed)
Synopsis: Referred in December 2021 for abnormal lung cancer screening CT by Marda Stalker, PA-C  Subjective:   PATIENT ID: Isaac Carrillo GENDER: male DOB: [birth year], MRN: [id number]  Chief Complaint  Patient presents with  . Consult    Seen by SG for screening CT scan.  Abnormal ct scan.     This is a 69 year old male, past medical history former smoker, on and off use, recently restarted and has quit again as of last week, presents for consultation after having abnormal lung cancer screening CT.  Patient's lung cancer screening CT was a follow-up from December 2020.  In December 2020 image was read as a lung RADS 2 with recommended 42-month follow-up.  Repeat lung cancer screening CT was done on 08/06/2020.  This revealed a slow progressive mixed subsolid lesion within the posterior aspect of the left lower lobe.  OV 08/15/2020: Patient is in the office today obviously very stressed out and anxious pacing in the room.  We had a long discussion regarding his CT scan findings and next best steps.  From a respiratory standpoint he is breathing okay has no complaints denies hemoptysis.  Overall just visibly upset about the concern that we have regarding his CT imaging.   Past Medical History:  Diagnosis Date  . Former smoker      Family History  Problem Relation Age of Onset  . Heart disease Mother   . Heart disease Father      Past Surgical History:  Procedure Laterality Date  . NO PAST SURGERIES      Social History   Socioeconomic History  . Marital status: Single    Spouse name: Not on file  . Number of children: Not on file  . Years of education: Not on file  . Highest education level: Not on file  Occupational History  . Not on file  Tobacco Use  . Smoking status: Former Smoker    Packs/day: 2.50    Years: 40.00    Pack years: 100.00    Types: Cigarettes    Quit date: 03/01/2016    Years since quitting: 4.4  . Smokeless tobacco: Never Used  . Tobacco  comment: Quit smoking 03/2016  Substance and Sexual Activity  . Alcohol use: No  . Drug use: Not on file  . Sexual activity: Not on file  Other Topics Concern  . Not on file  Social History Narrative  . Not on file   Social Determinants of Health   Financial Resource Strain: Not on file  Food Insecurity: Not on file  Transportation Needs: Not on file  Physical Activity: Not on file  Stress: Not on file  Social Connections: Not on file  Intimate Partner Violence: Not on file     No Known Allergies   Outpatient Medications Prior to Visit  Medication Sig Dispense Refill  . atorvastatin (LIPITOR) 20 MG tablet Take 20 mg by mouth daily.    . citalopram (CELEXA) 10 MG tablet Take 10 mg by mouth daily.    . citalopram (CELEXA) 20 MG tablet Take 20 mg by mouth daily.    Marland Kitchen ibuprofen (ADVIL,MOTRIN) 200 MG tablet Take 200 mg by mouth every 6 (six) hours as needed. For pain    . sildenafil (REVATIO) 20 MG tablet take 2 to 4 tablets by mouth once a day as needed     No facility-administered medications prior to visit.    Review of Systems  Constitutional: Negative for chills, fever, malaise/fatigue and weight loss.  HENT: Negative for hearing loss, sore throat and tinnitus.   Eyes: Negative for blurred vision and double vision.  Respiratory: Negative for cough, hemoptysis, sputum production, shortness of breath, wheezing and stridor.   Cardiovascular: Negative for chest pain, palpitations, orthopnea, leg swelling and PND.  Gastrointestinal: Negative for abdominal pain, constipation, diarrhea, heartburn, nausea and vomiting.  Genitourinary: Negative for dysuria, hematuria and urgency.  Musculoskeletal: Negative for joint pain and myalgias.  Skin: Negative for itching and rash.  Neurological: Negative for dizziness, tingling, weakness and headaches.  Endo/Heme/Allergies: Negative for environmental allergies. Does not bruise/bleed easily.  Psychiatric/Behavioral: Negative for depression.  The patient is not nervous/anxious and does not have insomnia.   All other systems reviewed and are negative.    Objective:  Physical Exam Vitals reviewed.  Constitutional:      General: He is not in acute distress.    Appearance: He is well-developed and well-nourished.  HENT:     Head: Normocephalic and atraumatic.     Mouth/Throat:     Mouth: Oropharynx is clear and moist.  Eyes:     General: No scleral icterus.    Conjunctiva/sclera: Conjunctivae normal.     Pupils: Pupils are equal, round, and reactive to light.  Neck:     Vascular: No JVD.     Trachea: No tracheal deviation.  Cardiovascular:     Rate and Rhythm: Normal rate and regular rhythm.     Pulses: Intact distal pulses.     Heart sounds: Normal heart sounds. No murmur heard.   Pulmonary:     Effort: Pulmonary effort is normal. No tachypnea, accessory muscle usage or respiratory distress.     Breath sounds: Normal breath sounds. No stridor. No wheezing, rhonchi or rales.  Abdominal:     General: Bowel sounds are normal. There is no distension.     Palpations: Abdomen is soft.     Tenderness: There is no abdominal tenderness.  Musculoskeletal:        General: No tenderness or edema.     Cervical back: Neck supple.  Lymphadenopathy:     Cervical: No cervical adenopathy.  Skin:    General: Skin is warm and dry.     Capillary Refill: Capillary refill takes less than 2 seconds.     Findings: No rash.  Neurological:     Mental Status: He is alert and oriented to person, place, and time.  Psychiatric:        Mood and Affect: Mood and affect normal.        Behavior: Behavior normal.      Vitals:   08/15/20 1042  BP: 132/68  Pulse: 84  Temp: 98 F (36.7 C)  TempSrc: Tympanic  SpO2: 98%  Weight: 232 lb 6 oz (105.4 kg)  Height: 6\' 4"  (1.93 m)   98% on RA BMI Readings from Last 3 Encounters:  08/15/20 28.29 kg/m  08/07/17 29.21 kg/m  02/15/12 26.17 kg/m   Wt Readings from Last 3 Encounters:   08/15/20 232 lb 6 oz (105.4 kg)  08/07/17 240 lb (108.9 kg)  02/15/12 215 lb (97.5 kg)    CBC No results found for: WBC, RBC, HGB, HCT, PLT, MCV, MCH, MCHC, RDW, LYMPHSABS, MONOABS, EOSABS, BASOSABS   Chest Imaging:  08/06/2020 CT scan of the chest, lung cancer screening: Read is a lung RADS 4B with a slowly enlarging cystic appearing lesion of the left lower lobe concerning for a primary bronchogenic carcinoma images reviewed with patient today in the office. The  patient's images have been independently reviewed by me.    Pulmonary Functions Testing Results: No flowsheet data found.  FeNO:   Pathology:   Echocardiogram:   Heart Catheterization:     Assessment & Plan:     ICD-10-CM   1. Nodule of lower lobe of left lung  R91.1 Ambulatory referral to Pulmonology    Procedural/ Surgical Case Request: VIDEO BRONCHOSCOPY WITH ENDOBRONCHIAL NAVIGATION    CT Super D Chest Wo Contrast    Pulmonary Function Test  2. Tobacco use  Z72.0 CT Super D Chest Wo Contrast  3. Former smoker  Z87.891 CT Super D Chest Wo Contrast  4. Situational anxiety  F41.8   5. Abnormal CT lung screening  R91.8     Discussion:  This is a 69 year old gentleman with an abnormal screening CT scan of the chest.  Patient found to have a enlarging nodule of the left lower lobe, subsolid nature with cystic component concerning for a primary bronchogenic carcinoma.  Patient is a longstanding history of tobacco use, former smoker.  Quit last week again after concern of the CT image findings.  Of note he is also very anxious about this whole situation.  Plan:  Today in the office we discussed risk benefits and alternatives of proceeding with tissue diagnosis versus watchful waiting. Patient would like to proceed with potential tissue diagnosis before ever considering resection. We also discussed direct resection of this type of lesion.  After discussing all of this with the patient he would rather have a  biopsy to prove that he has a cancer before making a decision for a lobectomy.  We discussed bronchoscopy with endobronchial navigation. Orders been placed for super D imaging Case request has been placed for 08/28/2020 Patient will also need to have full pulmonary function test as well as PFTs completed prior to consideration for surgery if in fact this is a malignancy.  We will wait to order PET scan after biopsy results.  Patient to follow-up in our office in approximately 4 weeks with pulmonary function test to see myself or Eric Form, NP.   Current Outpatient Medications:  .  atorvastatin (LIPITOR) 20 MG tablet, Take 20 mg by mouth daily., Disp: , Rfl:  .  citalopram (CELEXA) 10 MG tablet, Take 10 mg by mouth daily., Disp: , Rfl:  .  citalopram (CELEXA) 20 MG tablet, Take 20 mg by mouth daily., Disp: , Rfl:  .  ibuprofen (ADVIL,MOTRIN) 200 MG tablet, Take 200 mg by mouth every 6 (six) hours as needed. For pain, Disp: , Rfl:  .  sildenafil (REVATIO) 20 MG tablet, take 2 to 4 tablets by mouth once a day as needed, Disp: , Rfl:   I spent 62 minutes dedicated to the care of this patient on the date of this encounter to include pre-visit review of records, face-to-face time with the patient discussing conditions above, post visit ordering of testing, clinical documentation with the electronic health record, making appropriate referrals as documented, and communicating necessary findings to members of the patients care team.   Garner Nash, DO [facility] 08/15/2020 12:50 PM

## 2020-08-15 NOTE — Telephone Encounter (Signed)
I have scheduled his covid for 12/27 due to not scheduling on 12/24 or 12/25.  Will give pt info when he calls.

## 2020-08-15 NOTE — Progress Notes (Signed)
Synopsis: Referred in December 2021 for abnormal lung cancer screening CT by Marda Stalker, PA-C  Subjective:   PATIENT ID: Isaac Carrillo GENDER: male DOB: 04-10-1951, MRN: [id number]  Chief Complaint  Patient presents with  . Consult    Seen by SG for screening CT scan.  Abnormal ct scan.     This is a 69 year old male, past medical history former smoker, on and off use, recently restarted and has quit again as of last week, presents for consultation after having abnormal lung cancer screening CT.  Patient's lung cancer screening CT was a follow-up from December 2020.  In December 2020 image was read as a lung RADS 2 with recommended 56-month follow-up.  Repeat lung cancer screening CT was done on 08/06/2020.  This revealed a slow progressive mixed subsolid lesion within the posterior aspect of the left lower lobe.  OV 08/15/2020: Patient is in the office today obviously very stressed out and anxious pacing in the room.  We had a long discussion regarding his CT scan findings and next best steps.  From a respiratory standpoint he is breathing okay has no complaints denies hemoptysis.  Overall just visibly upset about the concern that we have regarding his CT imaging.   Past Medical History:  Diagnosis Date  . Former smoker      Family History  Problem Relation Age of Onset  . Heart disease Mother   . Heart disease Father      Past Surgical History:  Procedure Laterality Date  . NO PAST SURGERIES      Social History   Socioeconomic History  . Marital status: Single    Spouse name: Not on file  . Number of children: Not on file  . Years of education: Not on file  . Highest education level: Not on file  Occupational History  . Not on file  Tobacco Use  . Smoking status: Former Smoker    Packs/day: 2.50    Years: 40.00    Pack years: 100.00    Types: Cigarettes    Quit date: 03/01/2016    Years since quitting: 4.4  . Smokeless tobacco: Never Used  . Tobacco  comment: Quit smoking 03/2016  Substance and Sexual Activity  . Alcohol use: No  . Drug use: Not on file  . Sexual activity: Not on file  Other Topics Concern  . Not on file  Social History Narrative  . Not on file   Social Determinants of Health   Financial Resource Strain: Not on file  Food Insecurity: Not on file  Transportation Needs: Not on file  Physical Activity: Not on file  Stress: Not on file  Social Connections: Not on file  Intimate Partner Violence: Not on file     No Known Allergies   Outpatient Medications Prior to Visit  Medication Sig Dispense Refill  . atorvastatin (LIPITOR) 20 MG tablet Take 20 mg by mouth daily.    . citalopram (CELEXA) 10 MG tablet Take 10 mg by mouth daily.    . citalopram (CELEXA) 20 MG tablet Take 20 mg by mouth daily.    Marland Kitchen ibuprofen (ADVIL,MOTRIN) 200 MG tablet Take 200 mg by mouth every 6 (six) hours as needed. For pain    . sildenafil (REVATIO) 20 MG tablet take 2 to 4 tablets by mouth once a day as needed     No facility-administered medications prior to visit.    Review of Systems  Constitutional: Negative for chills, fever, malaise/fatigue and weight loss.  HENT: Negative for hearing loss, sore throat and tinnitus.   Eyes: Negative for blurred vision and double vision.  Respiratory: Negative for cough, hemoptysis, sputum production, shortness of breath, wheezing and stridor.   Cardiovascular: Negative for chest pain, palpitations, orthopnea, leg swelling and PND.  Gastrointestinal: Negative for abdominal pain, constipation, diarrhea, heartburn, nausea and vomiting.  Genitourinary: Negative for dysuria, hematuria and urgency.  Musculoskeletal: Negative for joint pain and myalgias.  Skin: Negative for itching and rash.  Neurological: Negative for dizziness, tingling, weakness and headaches.  Endo/Heme/Allergies: Negative for environmental allergies. Does not bruise/bleed easily.  Psychiatric/Behavioral: Negative for depression.  The patient is not nervous/anxious and does not have insomnia.   All other systems reviewed and are negative.    Objective:  Physical Exam Vitals reviewed.  Constitutional:      General: He is not in acute distress.    Appearance: He is well-developed and well-nourished.  HENT:     Head: Normocephalic and atraumatic.     Mouth/Throat:     Mouth: Oropharynx is clear and moist.  Eyes:     General: No scleral icterus.    Conjunctiva/sclera: Conjunctivae normal.     Pupils: Pupils are equal, round, and reactive to light.  Neck:     Vascular: No JVD.     Trachea: No tracheal deviation.  Cardiovascular:     Rate and Rhythm: Normal rate and regular rhythm.     Pulses: Intact distal pulses.     Heart sounds: Normal heart sounds. No murmur heard.   Pulmonary:     Effort: Pulmonary effort is normal. No tachypnea, accessory muscle usage or respiratory distress.     Breath sounds: Normal breath sounds. No stridor. No wheezing, rhonchi or rales.  Abdominal:     General: Bowel sounds are normal. There is no distension.     Palpations: Abdomen is soft.     Tenderness: There is no abdominal tenderness.  Musculoskeletal:        General: No tenderness or edema.     Cervical back: Neck supple.  Lymphadenopathy:     Cervical: No cervical adenopathy.  Skin:    General: Skin is warm and dry.     Capillary Refill: Capillary refill takes less than 2 seconds.     Findings: No rash.  Neurological:     Mental Status: He is alert and oriented to person, place, and time.  Psychiatric:        Mood and Affect: Mood and affect normal.        Behavior: Behavior normal.      Vitals:   08/15/20 1042  BP: 132/68  Pulse: 84  Temp: 98 F (36.7 C)  TempSrc: Tympanic  SpO2: 98%  Weight: 232 lb 6 oz (105.4 kg)  Height: 6\' 4"  (1.93 m)   98% on RA BMI Readings from Last 3 Encounters:  08/15/20 28.29 kg/m  08/07/17 29.21 kg/m  02/15/12 26.17 kg/m   Wt Readings from Last 3 Encounters:   08/15/20 232 lb 6 oz (105.4 kg)  08/07/17 240 lb (108.9 kg)  02/15/12 215 lb (97.5 kg)    CBC No results found for: WBC, RBC, HGB, HCT, PLT, MCV, MCH, MCHC, RDW, LYMPHSABS, MONOABS, EOSABS, BASOSABS   Chest Imaging:  08/06/2020 CT scan of the chest, lung cancer screening: Read is a lung RADS 4B with a slowly enlarging cystic appearing lesion of the left lower lobe concerning for a primary bronchogenic carcinoma images reviewed with patient today in the office. The  patient's images have been independently reviewed by me.    Pulmonary Functions Testing Results: No flowsheet data found.  FeNO:   Pathology:   Echocardiogram:   Heart Catheterization:     Assessment & Plan:     ICD-10-CM   1. Nodule of lower lobe of left lung  R91.1 Ambulatory referral to Pulmonology    Procedural/ Surgical Case Request: VIDEO BRONCHOSCOPY WITH ENDOBRONCHIAL NAVIGATION    CT Super D Chest Wo Contrast    Pulmonary Function Test  2. Tobacco use  Z72.0 CT Super D Chest Wo Contrast  3. Former smoker  Z87.891 CT Super D Chest Wo Contrast  4. Situational anxiety  F41.8   5. Abnormal CT lung screening  R91.8     Discussion:  This is a 69 year old gentleman with an abnormal screening CT scan of the chest.  Patient found to have a enlarging nodule of the left lower lobe, subsolid nature with cystic component concerning for a primary bronchogenic carcinoma.  Patient is a longstanding history of tobacco use, former smoker.  Quit last week again after concern of the CT image findings.  Of note he is also very anxious about this whole situation.  Plan:  Today in the office we discussed risk benefits and alternatives of proceeding with tissue diagnosis versus watchful waiting. Patient would like to proceed with potential tissue diagnosis before ever considering resection. We also discussed direct resection of this type of lesion.  After discussing all of this with the patient he would rather have a  biopsy to prove that he has a cancer before making a decision for a lobectomy.  We discussed bronchoscopy with endobronchial navigation. Orders been placed for super D imaging Case request has been placed for 08/28/2020 Patient will also need to have full pulmonary function test as well as PFTs completed prior to consideration for surgery if in fact this is a malignancy.  We will wait to order PET scan after biopsy results.  Patient to follow-up in our office in approximately 4 weeks with pulmonary function test to see myself or Eric Form, NP.   Current Outpatient Medications:  .  atorvastatin (LIPITOR) 20 MG tablet, Take 20 mg by mouth daily., Disp: , Rfl:  .  citalopram (CELEXA) 10 MG tablet, Take 10 mg by mouth daily., Disp: , Rfl:  .  citalopram (CELEXA) 20 MG tablet, Take 20 mg by mouth daily., Disp: , Rfl:  .  ibuprofen (ADVIL,MOTRIN) 200 MG tablet, Take 200 mg by mouth every 6 (six) hours as needed. For pain, Disp: , Rfl:  .  sildenafil (REVATIO) 20 MG tablet, take 2 to 4 tablets by mouth once a day as needed, Disp: , Rfl:   I spent 62 minutes dedicated to the care of this patient on the date of this encounter to include pre-visit review of records, face-to-face time with the patient discussing conditions above, post visit ordering of testing, clinical documentation with the electronic health record, making appropriate referrals as documented, and communicating necessary findings to members of the patients care team.   Garner Nash, DO [facility] 08/15/2020 12:50 PM

## 2020-08-15 NOTE — Telephone Encounter (Signed)
atc patient unable to reach According to Dr. Valeta Harms order should be for ENB, also patient needs Super D scheduled and covid.

## 2020-08-15 NOTE — Telephone Encounter (Signed)
When this patient calls back whoever gets him he needs a covid test he is having a ENB on 12/28

## 2020-08-15 NOTE — Telephone Encounter (Signed)
Please schedule the following:  Diagnosis: lung nodule  Procedure: Bronchoscopy   Has patient been spoken to by Provider and given informed consent? Yes  Anesthesia: General  Do you need Fluro? Yes  Priority: High  Date: 08/28/2020 Alternate Date:   Time: AM/ PM Location: [facility] Does patient have OSA? no DM? no Or Latex allergy? no Medication Restriction: no Anticoagulate/Antiplatelet: no Pre-op Labs Ordered: per anesthesia  Imaging request: Super D CT Scan   Please coordinate Pre-op COVID Testing

## 2020-08-15 NOTE — Patient Instructions (Signed)
Thank you for visiting Dr. Valeta Harms at [facility]. Today we recommend the following:  Orders Placed This Encounter  Procedures  . Procedural/ Surgical Case Request: VIDEO BRONCHOSCOPY WITH ENDOBRONCHIAL NAVIGATION  . CT Super D Chest Wo Contrast  . Ambulatory referral to Pulmonology  . Pulmonary Function Test   CT to be scheduled prior to procedure.   Tentative Case Date: 08/28/2020 Nothing to eat the night before.  Expect a phone call from pre-op services  Return in about 4 weeks (around 09/12/2020) for w/ Dr. Valeta Harms .    Please do your part to reduce the spread of COVID-19.

## 2020-08-15 NOTE — Telephone Encounter (Signed)
Lisa at [facility] was able to burn disk so super D appt has been canceled  I have called pt back and left another vm telling him I need to give him appt info for covid test and biopsy.

## 2020-08-16 NOTE — Telephone Encounter (Signed)
I have spoken to pt & gave him appt info.  Nothing further needed.

## 2020-08-21 ENCOUNTER — Inpatient Hospital Stay: Admission: RE | Admit: 2020-08-21 | Payer: Medicare HMO | Source: Ambulatory Visit

## 2020-08-22 ENCOUNTER — Telehealth: Payer: Self-pay | Admitting: *Deleted

## 2020-08-22 NOTE — Telephone Encounter (Signed)
Called and spoke with pt and he verified with me that he is not on any type of blood thinners.  He is aware of procedure with BI on 12/28 and he verified the date and time of procedure with me.

## 2020-08-23 ENCOUNTER — Other Ambulatory Visit: Payer: Self-pay

## 2020-08-23 ENCOUNTER — Encounter (HOSPITAL_COMMUNITY): Payer: Self-pay | Admitting: Pulmonary Disease

## 2020-08-23 NOTE — Progress Notes (Addendum)
Isaac Carrillo denies chest pain or shortness of breath. Isaac Carrillo denies any s/s of Covid or being in contact with anyone with Covid. Patient will be tested on 07/28/20-- patient confirmed appointment.   I instructed Isaac Carrillo to hold Advil until he is given permission after procedure.

## 2020-08-27 ENCOUNTER — Other Ambulatory Visit (HOSPITAL_COMMUNITY)
Admission: RE | Admit: 2020-08-27 | Discharge: 2020-08-27 | Disposition: A | Discharge status: Home / Self Care | Payer: Medicare HMO | Source: Ambulatory Visit | Attending: Pulmonary Disease | Admitting: Pulmonary Disease

## 2020-08-27 DIAGNOSIS — Z20822 Contact with and (suspected) exposure to covid-19: Secondary | ICD-10-CM | POA: Diagnosis not present

## 2020-08-27 DIAGNOSIS — Z01812 Encounter for preprocedural laboratory examination: Secondary | ICD-10-CM | POA: Diagnosis not present

## 2020-08-27 LAB — SARS CORONAVIRUS 2 (TAT 6-24 HRS): SARS Coronavirus 2: NEGATIVE

## 2020-08-28 ENCOUNTER — Ambulatory Visit (HOSPITAL_COMMUNITY): Payer: Medicare HMO | Admitting: Certified Registered Nurse Anesthetist

## 2020-08-28 ENCOUNTER — Ambulatory Visit (HOSPITAL_COMMUNITY)
Admission: RE | Admit: 2020-08-28 | Discharge: 2020-08-28 | Disposition: A | Discharge status: Home / Self Care | Payer: Medicare HMO | Attending: Pulmonary Disease | Admitting: Pulmonary Disease

## 2020-08-28 ENCOUNTER — Encounter (HOSPITAL_COMMUNITY)
Admission: RE | Disposition: A | Discharge status: Home / Self Care | Payer: Self-pay | Source: Home / Self Care | Attending: Pulmonary Disease

## 2020-08-28 ENCOUNTER — Ambulatory Visit (HOSPITAL_COMMUNITY): Payer: Medicare HMO

## 2020-08-28 ENCOUNTER — Other Ambulatory Visit: Payer: Self-pay

## 2020-08-28 ENCOUNTER — Encounter (HOSPITAL_COMMUNITY): Payer: Self-pay | Admitting: Pulmonary Disease

## 2020-08-28 DIAGNOSIS — J984 Other disorders of lung: Secondary | ICD-10-CM | POA: Diagnosis not present

## 2020-08-28 DIAGNOSIS — R911 Solitary pulmonary nodule: Secondary | ICD-10-CM | POA: Diagnosis present

## 2020-08-28 DIAGNOSIS — Z87891 Personal history of nicotine dependence: Secondary | ICD-10-CM | POA: Diagnosis not present

## 2020-08-28 DIAGNOSIS — Z9889 Other specified postprocedural states: Secondary | ICD-10-CM | POA: Diagnosis not present

## 2020-08-28 DIAGNOSIS — Z79899 Other long term (current) drug therapy: Secondary | ICD-10-CM | POA: Insufficient documentation

## 2020-08-28 DIAGNOSIS — R846 Abnormal cytological findings in specimens from respiratory organs and thorax: Secondary | ICD-10-CM | POA: Diagnosis not present

## 2020-08-28 DIAGNOSIS — E785 Hyperlipidemia, unspecified: Secondary | ICD-10-CM | POA: Diagnosis not present

## 2020-08-28 DIAGNOSIS — J449 Chronic obstructive pulmonary disease, unspecified: Secondary | ICD-10-CM | POA: Diagnosis not present

## 2020-08-28 DIAGNOSIS — J439 Emphysema, unspecified: Secondary | ICD-10-CM | POA: Diagnosis not present

## 2020-08-28 DIAGNOSIS — R69 Illness, unspecified: Secondary | ICD-10-CM | POA: Diagnosis not present

## 2020-08-28 HISTORY — PX: VIDEO BRONCHOSCOPY WITH ENDOBRONCHIAL NAVIGATION: SHX6175

## 2020-08-28 HISTORY — PX: BRONCHIAL BRUSHINGS: SHX5108

## 2020-08-28 HISTORY — DX: Centrilobular emphysema: J43.2

## 2020-08-28 HISTORY — DX: Chronic obstructive pulmonary disease, unspecified: J44.9

## 2020-08-28 HISTORY — PX: BRONCHIAL WASHINGS: SHX5105

## 2020-08-28 HISTORY — DX: Personal history of urinary calculi: Z87.442

## 2020-08-28 HISTORY — PX: ENDOBRONCHIAL ULTRASOUND: SHX5096

## 2020-08-28 HISTORY — PX: BRONCHIAL NEEDLE ASPIRATION BIOPSY: SHX5106

## 2020-08-28 HISTORY — DX: Anxiety disorder, unspecified: F41.9

## 2020-08-28 HISTORY — PX: BRONCHIAL BIOPSY: SHX5109

## 2020-08-28 SURGERY — VIDEO BRONCHOSCOPY WITH ENDOBRONCHIAL NAVIGATION
Anesthesia: General | Laterality: Left

## 2020-08-28 MED ORDER — AMISULPRIDE (ANTIEMETIC) 5 MG/2ML IV SOLN
10.0000 mg | Freq: Once | INTRAVENOUS | Status: DC | PRN
  Filled 2020-08-28: qty 4

## 2020-08-28 MED ORDER — ONDANSETRON HCL 4 MG/2ML IJ SOLN
INTRAMUSCULAR | Status: DC | PRN
  Administered 2020-08-28: 4 mg via INTRAVENOUS

## 2020-08-28 MED ORDER — LACTATED RINGERS IV SOLN: INTRAVENOUS | Status: DC

## 2020-08-28 MED ORDER — ROCURONIUM BROMIDE 10 MG/ML (PF) SYRINGE
PREFILLED_SYRINGE | INTRAVENOUS | Status: DC | PRN
  Administered 2020-08-28: 50 mg via INTRAVENOUS
  Administered 2020-08-28: 30 mg via INTRAVENOUS

## 2020-08-28 MED ORDER — SUGAMMADEX SODIUM 200 MG/2ML IV SOLN
INTRAVENOUS | Status: DC | PRN
  Administered 2020-08-28: 200 mg via INTRAVENOUS

## 2020-08-28 MED ORDER — OXYCODONE HCL 5 MG/5ML PO SOLN: 5.0000 mg | Freq: Once | ORAL | Status: DC | PRN

## 2020-08-28 MED ORDER — FENTANYL CITRATE (PF) 100 MCG/2ML IJ SOLN: 25.0000 ug | INTRAMUSCULAR | Status: DC | PRN

## 2020-08-28 MED ORDER — PHENYLEPHRINE 40 MCG/ML (10ML) SYRINGE FOR IV PUSH (FOR BLOOD PRESSURE SUPPORT)
PREFILLED_SYRINGE | INTRAVENOUS | Status: DC | PRN
  Administered 2020-08-28 (×5): 80 ug via INTRAVENOUS

## 2020-08-28 MED ORDER — PHENYLEPHRINE HCL-NACL 10-0.9 MG/250ML-% IV SOLN
INTRAVENOUS | Status: DC | PRN
  Administered 2020-08-28: 25 ug/min via INTRAVENOUS

## 2020-08-28 MED ORDER — FENTANYL CITRATE (PF) 250 MCG/5ML IJ SOLN
INTRAMUSCULAR | Status: DC | PRN
  Administered 2020-08-28: 100 ug via INTRAVENOUS

## 2020-08-28 MED ORDER — PROPOFOL 10 MG/ML IV BOLUS
INTRAVENOUS | Status: DC | PRN
  Administered 2020-08-28: 170 mg via INTRAVENOUS

## 2020-08-28 MED ORDER — DEXAMETHASONE SODIUM PHOSPHATE 10 MG/ML IJ SOLN
INTRAMUSCULAR | Status: DC | PRN
  Administered 2020-08-28: 10 mg via INTRAVENOUS

## 2020-08-28 MED ORDER — OXYCODONE HCL 5 MG PO TABS: 5.0000 mg | ORAL_TABLET | Freq: Once | ORAL | Status: DC | PRN

## 2020-08-28 MED ORDER — ONDANSETRON HCL 4 MG/2ML IJ SOLN: 4.0000 mg | Freq: Once | INTRAMUSCULAR | Status: DC | PRN

## 2020-08-28 MED ORDER — LIDOCAINE 2% (20 MG/ML) 5 ML SYRINGE
INTRAMUSCULAR | Status: DC | PRN
  Administered 2020-08-28: 100 mg via INTRAVENOUS

## 2020-08-28 MED ORDER — PROPOFOL 500 MG/50ML IV EMUL
INTRAVENOUS | Status: DC | PRN
  Administered 2020-08-28: 50 ug/kg/min via INTRAVENOUS

## 2020-08-28 SURGICAL SUPPLY — 46 items

## 2020-08-28 NOTE — Discharge Instructions (Signed)
Flexible Bronchoscopy, Care After This sheet gives you information about how to care for yourself after your test. Your doctor may also give you more specific instructions. If you have problems or questions, contact your doctor. Follow these instructions at home: Eating and drinking.  The day after the test, go back to your normal diet. Driving  Do not drive for 24 hours if you were given a medicine to help you relax (sedative).  Do not drive or use heavy machinery while taking prescription pain medicine. General instructions   Take over-the-counter and prescription medicines only as told by your doctor.  Return to your normal activities as told. Ask what activities are safe for you.  Do not use any products that have nicotine or tobacco in them. This includes cigarettes and e-cigarettes. If you need help quitting, ask your doctor.  Keep all follow-up visits as told by your doctor. This is important. It is very important if you had a tissue sample (biopsy) taken. Get help right away if:  You have shortness of breath that gets worse.  You get light-headed.  You feel like you are going to pass out (faint).  You have chest pain.  You cough up: ? More than a little blood. ? More blood than before. Summary  Do not eat or drink anything (not even water) for 2 hours after your test, or until your numbing medicine wears off.  Do not use cigarettes. Do not use e-cigarettes.  Get help right away if you have chest pain. This information is not intended to replace advice given to you by your health care provider. Make sure you discuss any questions you have with your health care provider. Document Revised: 07/31/2017 Document Reviewed: 09/05/2016 Elsevier Patient Education  2020 [employer].

## 2020-08-28 NOTE — Anesthesia Preprocedure Evaluation (Signed)
Anesthesia Evaluation  Patient identified by MRN, date of birth, ID band Patient awake    Reviewed: Allergy & Precautions, NPO status , Patient's Chart, lab work & pertinent test results  History of Anesthesia Complications Negative for: history of anesthetic complications  Airway Mallampati: I  TM Distance: >3 FB Neck ROM: Full    Dental  (+) Upper Dentures, Partial Lower   Pulmonary COPD, former smoker,  Lung nodule   Pulmonary exam normal        Cardiovascular Normal cardiovascular exam  HLD   Neuro/Psych Anxiety negative neurological ROS     GI/Hepatic negative GI ROS, Neg liver ROS,   Endo/Other  negative endocrine ROS  Renal/GU negative Renal ROS  negative genitourinary   Musculoskeletal negative musculoskeletal ROS (+)   Abdominal   Peds  Hematology negative hematology ROS (+)   Anesthesia Other Findings   Reproductive/Obstetrics                             Anesthesia Physical Anesthesia Plan  ASA: II  Anesthesia Plan: General   Post-op Pain Management:    Induction: Intravenous  PONV Risk Score and Plan: 2 and Ondansetron, Dexamethasone, Treatment may vary due to age or medical condition and Midazolam  Airway Management Planned: Oral ETT  Additional Equipment: None  Intra-op Plan:   Post-operative Plan: Extubation in OR  Informed Consent: I have reviewed the patients History and Physical, chart, labs and discussed the procedure including the risks, benefits and alternatives for the proposed anesthesia with the patient or authorized representative who has indicated his/her understanding and acceptance.     Dental advisory given  Plan Discussed with:   Anesthesia Plan Comments:         Anesthesia Quick Evaluation

## 2020-08-28 NOTE — Anesthesia Procedure Notes (Signed)
Procedure Name: Intubation Date/Time: 08/28/2020 11:47 AM Performed by: Darletta Moll, CRNA Pre-anesthesia Checklist: Patient identified, Emergency Drugs available, Suction available and Patient being monitored Patient Re-evaluated:Patient Re-evaluated prior to induction Oxygen Delivery Method: Circle system utilized Preoxygenation: Pre-oxygenation with 100% oxygen Induction Type: IV induction Ventilation: Mask ventilation without difficulty Laryngoscope Size: Mac and 4 Grade View: Grade I Tube type: Oral Tube size: 8.5 mm Number of attempts: 1 Airway Equipment and Method: Stylet Placement Confirmation: ETT inserted through vocal cords under direct vision,  positive ETCO2 and breath sounds checked- equal and bilateral Secured at: 22 cm Tube secured with: Tape Dental Injury: Teeth and Oropharynx as per pre-operative assessment

## 2020-08-28 NOTE — Anesthesia Postprocedure Evaluation (Signed)
Anesthesia Post Note  Patient: Jabez Molner  Procedure(s) Performed: VIDEO BRONCHOSCOPY WITH ENDOBRONCHIAL NAVIGATION (Left ) BRONCHIAL BIOPSIES BRONCHIAL BRUSHINGS BRONCHIAL NEEDLE ASPIRATION BIOPSIES BRONCHIAL WASHINGS ENDOBRONCHIAL ULTRASOUND     Patient location during evaluation: PACU Anesthesia Type: General Level of consciousness: awake and alert Pain management: pain level controlled Vital Signs Assessment: post-procedure vital signs reviewed and stable Respiratory status: spontaneous breathing, nonlabored ventilation and respiratory function stable Cardiovascular status: blood pressure returned to baseline and stable Postop Assessment: no apparent nausea or vomiting Anesthetic complications: no   No complications documented.  Last Vitals:  Vitals:   08/28/20 1340 08/28/20 1352  BP: 135/84   Pulse: 65   Resp: 13   Temp:  37 C  SpO2: 99%     Last Pain:  Vitals:   08/28/20 1340  TempSrc:   PainSc: 0-No pain                 Audry Pili

## 2020-08-28 NOTE — Interval H&P Note (Signed)
History and Physical Interval Note:  08/28/2020 11:13 AM  Isaac Carrillo  has presented today for surgery, with the diagnosis of Lung nodule.  The various methods of treatment have been discussed with the patient and family. After consideration of risks, benefits and other options for treatment, the patient has consented to  Procedure(s): [place] (Left) as a surgical intervention.  The patient's history has been reviewed, patient examined, no change in status, stable for surgery.  I have reviewed the patient's chart and labs.  Questions were answered to the patient's satisfaction.     [place]

## 2020-08-28 NOTE — Op Note (Signed)
Video Bronchoscopy with Electromagnetic Navigation and Endobronchial Ultrasound Procedure Note  Date of Operation: 08/28/2020  Pre-op Diagnosis: Left lower lobe nodule, cystic lesion  Post-op Diagnosis: Left lower lobe nodule, cystic lesion  Surgeon: Garner Nash, DO   Assistants: None   Anesthesia: General endotracheal anesthesia  Operation: Flexible video fiberoptic bronchoscopy with electromagnetic navigation and biopsies.  Estimated Blood Loss: Minimal, <6LS  Complications: None   Indications and History: Isaac Carrillo is a 69 y.o. male with left lower lobe nodule/cystic lesion.  The risks, benefits, complications, treatment options and expected outcomes were discussed with the patient.  The possibilities of pneumothorax, pneumonia, reaction to medication, pulmonary aspiration, perforation of a viscus, bleeding, failure to diagnose a condition and creating a complication requiring transfusion or operation were discussed with the patient who freely signed the consent.    Description of Procedure: The patient was seen in the Preoperative Area, was examined and was deemed appropriate to proceed.  The patient was taken to [facility] endoscopy room 2, identified as [name] and the procedure verified as Flexible Video Fiberoptic Bronchoscopy.  A Time Out was held and the above information confirmed.   Prior to the date of the procedure a high-resolution CT scan of the chest was performed. Utilizing Brooks a virtual tracheobronchial tree was generated to allow the creation of distinct navigation pathways to the patient's parenchymal abnormalities. After being taken to the operating room general anesthesia was initiated and the patient  was orally intubated. The video fiberoptic bronchoscope was introduced via the endotracheal tube and a general inspection was performed which showed normal right and left lung anatomy no evidence of endobronchial lesion.   Navigational  bronchoscopy procedure: The extendable working channel and locator guide were introduced into the bronchoscope.  A full fluoroscopic sweep was obtained from RAO 25 degrees to LAO 25 degrees with an inspiratory breath-hold APL at 20 cm of water. The distinct navigation pathways prepared prior to this procedure were then utilized to navigate to within 1 cm of patient's lesion(s) identified on CT scan. The extendable working channel was secured into place and the locator guide was withdrawn. Under fluoroscopic guidance transbronchial needle brushings, transbronchial Wang needle biopsies, and transbronchial forceps biopsies were performed to be sent for cytology and pathology. A bronchioalveolar lavage was performed in the left lower lobe and sent for cytology.  Endobronchial ultrasound procedure: The standard therapeutic bronchoscope was removed and the airway and the Olympus endobronchial ultrasound was inserted.  Using real-time ultrasound we evaluated to the left hilum there was no enlarged lymph nodes identified.  The subcarinal space was examined under real-time ultrasound, paratracheal area as well as right hilum.  All 4 locations were visualized.  At the end of the procedure a general airway inspection was performed and there was no evidence of active bleeding.  The standard therapeutic bronchoscope was reinserted to the airway, bilateral aspiration of the main stems along with distal subsegments were suctioned free and clear of any remaining blood clots and secretions.  Cup was brought to just above the main carina there was no evidence of active bleeding. The bronchoscope was removed.  The patient tolerated the procedure well. There was no significant blood loss and there were no obvious complications. A post-procedural chest x-ray is pending.  Samples: 1. Transbronchial needle brushings from left lower lobe 2. Transbronchial Wang needle biopsies from left lower lobe 3. Transbronchial forceps  biopsies from left lower lobe 4. Bronchoalveolar lavage from left lower lobe  Plans:  The  patient will be discharged from the PACU to home when recovered from anesthesia and after chest x-ray is reviewed. We will review the cytology, pathology and microbiology results with the patient when they become available. Outpatient followup will be with Garner Nash, DO.   Garner Nash, DO [facility] 08/28/2020 1:16 PM

## 2020-08-28 NOTE — Transfer of Care (Signed)
Immediate Anesthesia Transfer of Care Note  Patient: Isaac Carrillo  Procedure(s) Performed: VIDEO BRONCHOSCOPY WITH ENDOBRONCHIAL NAVIGATION (Left ) BRONCHIAL BIOPSIES BRONCHIAL BRUSHINGS BRONCHIAL NEEDLE ASPIRATION BIOPSIES BRONCHIAL WASHINGS ENDOBRONCHIAL ULTRASOUND  Patient Location: PACU  Anesthesia Type:General  Level of Consciousness: drowsy and patient cooperative  Airway & Oxygen Therapy: Patient Spontanous Breathing  Post-op Assessment: Report given to RN, Post -op Vital signs reviewed and stable and Patient moving all extremities X 4  Post vital signs: Reviewed and stable  Last Vitals:  Vitals Value Taken Time  BP 125/64 08/28/20 1310  Temp 37 C 08/28/20 1310  Pulse 70 08/28/20 1310  Resp 14 08/28/20 1310  SpO2 97 % 08/28/20 1310    Last Pain:  Vitals:   08/28/20 0841  TempSrc: Oral         Complications: No complications documented.

## 2020-08-29 ENCOUNTER — Encounter (HOSPITAL_COMMUNITY): Payer: Self-pay | Admitting: Pulmonary Disease

## 2020-08-30 ENCOUNTER — Telehealth: Payer: Self-pay | Admitting: Pulmonary Disease

## 2020-08-30 DIAGNOSIS — C3490 Malignant neoplasm of unspecified part of unspecified bronchus or lung: Secondary | ICD-10-CM

## 2020-08-30 LAB — CYTOLOGY - NON PAP

## 2020-08-30 NOTE — Telephone Encounter (Signed)
Spoke with the pt  He is asking for the results of his bronch done 08/28/20  Please advise, thanks

## 2020-08-30 NOTE — Telephone Encounter (Signed)
No unfortunately the pathology results are not back at this time.  Garner Nash, DO [facility] 08/30/2020 12:51 PM

## 2020-09-03 NOTE — Telephone Encounter (Signed)
Patient calling about results Any up date? Dr. Valeta Harms please advise

## 2020-09-03 NOTE — Telephone Encounter (Signed)
PCCM  I called and spoke with the patient about his path results.   Suspicious for malignancy, concerning for adenocarcinoma  PET ordered PFTS scheduled for 13th  Referral placed to Drs. Lightfoot/Hendrickson for possible resection   Garner Nash, DO [facility] 09/03/2020 9:47 AM

## 2020-09-06 ENCOUNTER — Other Ambulatory Visit: Payer: Self-pay | Admitting: *Deleted

## 2020-09-06 NOTE — Progress Notes (Signed)
The proposed treatment discussed in cancer conference 09/06/20 is for discussion purpose only and is not a binding recommendation.  The patient was not physically examined nor present for their treatment options.  Therefore, final treatment plans cannot be decided.

## 2020-09-12 ENCOUNTER — Other Ambulatory Visit: Payer: Self-pay

## 2020-09-12 ENCOUNTER — Ambulatory Visit (HOSPITAL_COMMUNITY)
Admission: RE | Admit: 2020-09-12 | Discharge: 2020-09-12 | Disposition: A | Discharge status: Home / Self Care | Payer: Medicare HMO | Source: Ambulatory Visit | Attending: Pulmonary Disease | Admitting: Pulmonary Disease

## 2020-09-12 DIAGNOSIS — R911 Solitary pulmonary nodule: Secondary | ICD-10-CM | POA: Diagnosis not present

## 2020-09-12 DIAGNOSIS — J984 Other disorders of lung: Secondary | ICD-10-CM | POA: Diagnosis not present

## 2020-09-12 DIAGNOSIS — C3490 Malignant neoplasm of unspecified part of unspecified bronchus or lung: Secondary | ICD-10-CM | POA: Diagnosis present

## 2020-09-12 LAB — GLUCOSE, CAPILLARY: Glucose-Capillary: 103 mg/dL — ABNORMAL HIGH (ref 70–99)

## 2020-09-12 MED ORDER — FLUDEOXYGLUCOSE F - 18 (FDG) INJECTION
11.1600 | Freq: Once | INTRAVENOUS | Status: AC | PRN
  Administered 2020-09-12: 11.16 via INTRAVENOUS

## 2020-09-13 ENCOUNTER — Ambulatory Visit (INDEPENDENT_AMBULATORY_CARE_PROVIDER_SITE_OTHER): Payer: Medicare HMO | Admitting: Pulmonary Disease

## 2020-09-13 ENCOUNTER — Other Ambulatory Visit: Payer: Self-pay

## 2020-09-13 DIAGNOSIS — R911 Solitary pulmonary nodule: Secondary | ICD-10-CM | POA: Diagnosis not present

## 2020-09-13 LAB — PULMONARY FUNCTION TEST
DL/VA % pred: 101 %
DL/VA: 4.01 ml/min/mmHg/L
DLCO cor % pred: 99 %
DLCO cor: 31.99 ml/min/mmHg
DLCO unc % pred: 99 %
DLCO unc: 31.99 ml/min/mmHg
FEF 25-75 Post: 3.59 L/sec
FEF 25-75 Pre: 3 L/sec
FEF2575-%Change-Post: 19 %
FEF2575-%Pred-Post: 109 %
FEF2575-%Pred-Pre: 92 %
FEV1-%Change-Post: 4 %
FEV1-%Pred-Post: 94 %
FEV1-%Pred-Pre: 90 %
FEV1-Post: 4.02 L
FEV1-Pre: 3.85 L
FEV1FVC-%Change-Post: 3 %
FEV1FVC-%Pred-Pre: 101 %
FEV6-%Change-Post: 2 %
FEV6-%Pred-Post: 94 %
FEV6-%Pred-Pre: 91 %
FEV6-Post: 5.15 L
FEV6-Pre: 5.01 L
FEV6FVC-%Change-Post: 1 %
FEV6FVC-%Pred-Post: 104 %
FEV6FVC-%Pred-Pre: 103 %
FVC-%Change-Post: 1 %
FVC-%Pred-Post: 90 %
FVC-%Pred-Pre: 89 %
FVC-Post: 5.16 L
FVC-Pre: 5.11 L
Post FEV1/FVC ratio: 78 %
Post FEV6/FVC ratio: 100 %
Pre FEV1/FVC ratio: 75 %
Pre FEV6/FVC Ratio: 98 %
RV % pred: 143 %
RV: 4.01 L
TLC % pred: 106 %
TLC: 8.98 L

## 2020-09-13 NOTE — Progress Notes (Signed)
PFT done today. 

## 2020-09-17 ENCOUNTER — Ambulatory Visit (INDEPENDENT_AMBULATORY_CARE_PROVIDER_SITE_OTHER): Payer: Medicare HMO | Admitting: Pulmonary Disease

## 2020-09-17 ENCOUNTER — Other Ambulatory Visit: Payer: Self-pay

## 2020-09-17 DIAGNOSIS — C3492 Malignant neoplasm of unspecified part of left bronchus or lung: Secondary | ICD-10-CM | POA: Insufficient documentation

## 2020-09-17 NOTE — Progress Notes (Signed)
Virtual Visit via Telephone Note  I connected with Isaac Carrillo on 09/17/20 at  9:00 AM EST by telephone and verified that I am speaking with the correct person using two identifiers.  Location: Patient: Home Provider: Office   I discussed the limitations, risks, security and privacy concerns of performing an evaluation and management service by telephone and the availability of in person appointments. I also discussed with the patient that there may be a patient responsible charge related to this service. The patient expressed understanding and agreed to proceed.   History of Present Illness:  This is a 70 year old male, past medical history former smoker, on and off use, recently restarted and has quit again as of last week, presents for consultation after having abnormal lung cancer screening CT.  Patient's lung cancer screening CT was a follow-up from December 2020.  In December 2020 image was read as a lung RADS 2 with recommended 55-month follow-up.  Repeat lung cancer screening CT was done on 08/06/2020.  This revealed a slow progressive mixed subsolid lesion within the posterior aspect of the left lower lobe.  OV 08/15/2020: Patient is in the office today obviously very stressed out and anxious pacing in the room.  We had a long discussion regarding his CT scan findings and next best steps.  From a respiratory standpoint he is breathing okay has no complaints denies hemoptysis.  Overall just visibly upset about the concern that we have regarding his CT imaging.  OV 09/17/2020: Telephone encounter due to inclement weather.  Spoke with patient today via telephone to discuss his recent bronchoscopy results as well as nuclear medicine pet imaging.  The nuclear medicine pet image revealed moderate hypermetabolic uptake within the T11 vertebral body.As well as low-level FDG uptake within the pulmonary lesions   Observations/Objective:  Nuclear Medicine pet imaging:  There was low-level FDG  uptake within the semisolid pulmonary lesions identified on.  There was no metastatic mediastinal or hilar nodes identified.  There was a small focus of hypermetabolism within the T11 vertebral body. The patient's images have been independently reviewed by me.    PFT Results Latest Ref Rng & Units 09/13/2020  FVC-Pre L 5.11  FVC-Predicted Pre % 89  FVC-Post L 5.16  FVC-Predicted Post % 90  Pre FEV1/FVC % % 75  Post FEV1/FCV % % 78  FEV1-Pre L 3.85  FEV1-Predicted Pre % 90  FEV1-Post L 4.02  DLCO uncorrected ml/min/mmHg 31.99  DLCO UNC% % 99  DLCO corrected ml/min/mmHg 31.99  DLCO COR %Predicted % 99  DLVA Predicted % 101  TLC L 8.98  TLC % Predicted % 106  RV % Predicted % 143   Pathology: 08/28/2020 pathology results Left lower lobe brushings and fine-needle aspirations with atypical cells present suspicious for malignancy.  Comment read by pathology with morphology that favors adenocarcinoma.  Assessment and Plan:  Adenocarcinoma, non-small cell lung cancer, left lower lobe. Abnormal nuclear medicine pet imaging Focal area of hypermetabolism with no CT defined lesion within the T11 vertebral body. Normal pulmonary function tests with evidence of mild air trapping. Plan: Patient has already been referred to cardiothoracic surgery and has an appointment this week to meet with Dr. Roxan Hockey. We discussed his pulmonary function tests as well as his recent nuclear medicine pet imaging. We will complete recommendations by radiology for further evaluation of the T11 spot that was found on pet imaging to confirm I will lesion present.  A thoracic MRI has been ordered.  Follow Up Instructions:  RTC  3 months with Dr. Valeta Harms or APP    I discussed the assessment and treatment plan with the patient. The patient was provided an opportunity to ask questions and all were answered. The patient agreed with the plan and demonstrated an understanding of the instructions.   The patient was  advised to call back or seek an in-person evaluation if the symptoms worsen or if the condition fails to improve as anticipated.  I provided 22 minutes of non-face-to-face time during this encounter.   Isaac Nash, DO

## 2020-09-18 ENCOUNTER — Telehealth: Payer: Self-pay | Admitting: Pulmonary Disease

## 2020-09-18 DIAGNOSIS — Z01818 Encounter for other preprocedural examination: Secondary | ICD-10-CM

## 2020-09-18 NOTE — Telephone Encounter (Signed)
Labs ordered. lmtcb X1 for pt- needs to come in at his convenience to have labs drawn prior to MRI.

## 2020-09-19 ENCOUNTER — Institutional Professional Consult (permissible substitution): Payer: Medicare HMO | Admitting: Thoracic Surgery (Cardiothoracic Vascular Surgery)

## 2020-09-19 ENCOUNTER — Other Ambulatory Visit: Payer: Self-pay | Admitting: *Deleted

## 2020-09-19 ENCOUNTER — Encounter: Payer: Self-pay | Admitting: Thoracic Surgery (Cardiothoracic Vascular Surgery)

## 2020-09-19 ENCOUNTER — Other Ambulatory Visit: Payer: Self-pay

## 2020-09-19 VITALS — BP 135/80 | HR 80 | Temp 97.7°F | Resp 20 | Ht 76.0 in | Wt 225.0 lb

## 2020-09-19 DIAGNOSIS — E669 Obesity, unspecified: Secondary | ICD-10-CM | POA: Insufficient documentation

## 2020-09-19 DIAGNOSIS — R911 Solitary pulmonary nodule: Secondary | ICD-10-CM | POA: Diagnosis not present

## 2020-09-19 DIAGNOSIS — F418 Other specified anxiety disorders: Secondary | ICD-10-CM | POA: Insufficient documentation

## 2020-09-19 DIAGNOSIS — C3430 Malignant neoplasm of lower lobe, unspecified bronchus or lung: Secondary | ICD-10-CM | POA: Insufficient documentation

## 2020-09-19 DIAGNOSIS — H44002 Unspecified purulent endophthalmitis, left eye: Secondary | ICD-10-CM | POA: Insufficient documentation

## 2020-09-19 DIAGNOSIS — N529 Male erectile dysfunction, unspecified: Secondary | ICD-10-CM | POA: Insufficient documentation

## 2020-09-19 DIAGNOSIS — E781 Pure hyperglyceridemia: Secondary | ICD-10-CM | POA: Insufficient documentation

## 2020-09-19 DIAGNOSIS — I2510 Atherosclerotic heart disease of native coronary artery without angina pectoris: Secondary | ICD-10-CM | POA: Insufficient documentation

## 2020-09-19 DIAGNOSIS — J432 Centrilobular emphysema: Secondary | ICD-10-CM | POA: Insufficient documentation

## 2020-09-19 DIAGNOSIS — Z720 Tobacco use: Secondary | ICD-10-CM | POA: Insufficient documentation

## 2020-09-19 NOTE — Progress Notes (Signed)
PCP is Isaac Stalker, PA-C Referring Provider is Carrillo, Isaac Graves, DO  Chief Complaint  Patient presents with  . 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  Isaac Carrillo is a 70 year old former smoker with a past history significant for tobacco abuse, COPD, anxiety, and kidney stones. He has greater than a 40-pack-year history of smoking. He quit in 2017. He had a low-dose CT for lung cancer screening in December 2020. There was a groundglass opacity in the left lower lobe read as a lung RADS 2. He had another scan in December 2021 which showed a slight increase in size and some new solid components. The mean diameter was 29.9 mm. Isaac Carrillo did a bronchoscopy. Brushings needle aspiration showed atypical cells, probable adenocarcinoma. He had a PET/CT which showed some mild uptake in that area. There was moderate hypermetabolism in the T11 vertebral body but no CT correlate.  He feels well. He works at [employer]. He does have some lifting as part of his job. He is not having any chest pain, pressure, or tightness. He denies shortness of breath, cough, wheezing. He has not had any change in appetite or weight loss. Zubrod Score: At the time of surgery this patient's most appropriate activity status/level should be described as: [x]     0    Normal activity, no symptoms []     1    Restricted in physical strenuous activity but ambulatory, able to do out light work []     2    Ambulatory and capable of self care, unable to do work activities, up and about >50 % of waking hours                              []     3    Only limited self care, in bed greater than 50% of waking hours []     4    Completely disabled, no self care, confined to bed or chair []     5    Moribund  Past Medical History:  Diagnosis Date  . Anxiety   . Centrilobular emphysema ([place])   . COPD (chronic obstructive pulmonary disease)  ([place])   . Former smoker   . History of kidney stones     x2 passed    Past Surgical History:  Procedure Laterality Date  . BRONCHIAL BIOPSY  08/28/2020   Procedure: BRONCHIAL BIOPSIES;  Surgeon: Isaac Nash, DO;  Location: [facility];  Service: Pulmonary;;  . BRONCHIAL BRUSHINGS  08/28/2020   Procedure: BRONCHIAL BRUSHINGS;  Surgeon: Isaac Nash, DO;  Location: [facility];  Service: Pulmonary;;  . BRONCHIAL NEEDLE ASPIRATION BIOPSY  08/28/2020   Procedure: BRONCHIAL NEEDLE ASPIRATION BIOPSIES;  Surgeon: Isaac Nash, DO;  Location: [facility];  Service: Pulmonary;;  . BRONCHIAL WASHINGS  08/28/2020   Procedure: BRONCHIAL WASHINGS;  Surgeon: Isaac Nash, DO;  Location: [facility];  Service: Pulmonary;;  . DENTAL SURGERY    . ENDOBRONCHIAL ULTRASOUND  08/28/2020   Procedure: ENDOBRONCHIAL ULTRASOUND;  Surgeon: Isaac Nash, DO;  Location: [facility];  Service: Pulmonary;;  . VIDEO BRONCHOSCOPY WITH ENDOBRONCHIAL NAVIGATION Left 08/28/2020   Procedure: VIDEO BRONCHOSCOPY WITH ENDOBRONCHIAL NAVIGATION;  Surgeon: Isaac Nash, DO;  Location: [facility];  Service: Pulmonary;  Laterality: Left;    Family History  Problem Relation Age of Onset  .  Heart disease Mother   . Heart disease Father     Social History Social History   Tobacco Use  . Smoking status: Former Smoker    Packs/day: 2.50    Years: 40.00    Pack years: 100.00    Types: Cigarettes    Quit date: 03/01/2016    Years since quitting: 4.5  . Smokeless tobacco: Never Used  . Tobacco comment: Quit smoking 03/2016  Vaping Use  . Vaping Use: Never used  Substance Use Topics  . Alcohol use: No  . Drug use: Never    Current Outpatient Medications  Medication Sig Dispense Refill  . atorvastatin (LIPITOR) 20 MG tablet Take 20 mg by mouth daily.    . citalopram (CELEXA) 10 MG tablet Take 10 mg by mouth daily. 10 MG + 20 MG=30 MG DAILY DOSAGE    . citalopram (CELEXA) 20 MG tablet Take  20 mg by mouth daily. 20 MG + 10 MG=30 MG DAILY DOSAGE    . ibuprofen (ADVIL,MOTRIN) 200 MG tablet Take 800 mg by mouth every 8 (eight) hours as needed (PAIN.).    Marland Kitchen sildenafil (REVATIO) 20 MG tablet Take 40-80 mg by mouth daily as needed (erectile dysfunction.).     No current facility-administered medications for this visit.    No Known Allergies  Review of Systems  Constitutional: Negative for activity change, fatigue and unexpected weight change.  HENT: Negative for trouble swallowing and voice change.   Eyes: Positive for visual disturbance.  Respiratory: Negative for cough, shortness of breath and wheezing.   Cardiovascular: Negative for chest pain, palpitations and leg swelling.  Gastrointestinal: Negative for abdominal distention and abdominal pain.  Genitourinary: Negative for difficulty urinating and dysuria.  Musculoskeletal: Positive for back pain (Muscular low back). Negative for arthralgias.  Hematological: Negative for adenopathy. Bruises/bleeds easily (Bruises easily, no excessive bleeding).  Psychiatric/Behavioral: The patient is nervous/anxious.   All other systems reviewed and are negative.   BP 135/80   Pulse 80   Temp 97.7 F (36.5 C) (Skin)   Resp 20   Ht 6\' 4"  (1.93 m)   Wt 225 lb (102.1 kg)   SpO2 95% Comment: RA  BMI 27.39 kg/m  Physical Exam Vitals reviewed.  Constitutional:      General: He is not in acute distress.    Appearance: Normal appearance.  HENT:     Head: Normocephalic and atraumatic.  Eyes:     General: No scleral icterus.    Extraocular Movements: Extraocular movements intact.  Neck:     Vascular: No carotid bruit.  Cardiovascular:     Rate and Rhythm: Normal rate and regular rhythm.     Pulses: Normal pulses.     Heart sounds: Normal heart sounds. No murmur heard.   Pulmonary:     Effort: Pulmonary effort is normal. No respiratory distress.     Breath sounds: Normal breath sounds. No wheezing or rales.  Abdominal:      General: There is no distension.     Palpations: Abdomen is soft.     Tenderness: There is no abdominal tenderness.  Musculoskeletal:        General: No swelling.     Cervical back: Neck supple.  Lymphadenopathy:     Cervical: No cervical adenopathy.  Skin:    General: Skin is warm and dry.  Neurological:     General: No focal deficit present.     Mental Status: He is alert and oriented to person, place, and time.  Cranial Nerves: No cranial nerve deficit.     Motor: No weakness.      Diagnostic Tests: CT CHEST WITHOUT CONTRAST LOW-DOSE FOR LUNG CANCER SCREENING  TECHNIQUE: Multidetector CT imaging of the chest was performed following the standard protocol without IV contrast.  COMPARISON:  Low-dose lung cancer screening chest CT 08/04/2019.  FINDINGS: Cardiovascular: Heart size is normal. There is no significant pericardial fluid, thickening or pericardial calcification. There is aortic atherosclerosis, as well as atherosclerosis of the great vessels of the mediastinum and the coronary arteries, including calcified atherosclerotic plaque in the left main, left anterior descending, left circumflex and right coronary arteries.  Mediastinum/Nodes: No pathologically enlarged mediastinal or hilar lymph nodes. Please note that accurate exclusion of hilar adenopathy is limited on noncontrast CT scans. Esophagus is unremarkable in appearance. No axillary lymphadenopathy.  Lungs/Pleura: In addition to several tiny pulmonary nodules scattered throughout the lungs bilaterally, there is a slowly enlarging area of ground-glass attenuation, septal thickening, internal cystic change and nodularity in the posterior aspect of the left lower lobe which has minimally progressed over prior examinations dating back to 2017, but now demonstrates some internal solid components. At this time, this lesion overall measures 29.9 mm in volume derived mean diameter, with a central solid  component measuring up to 5 mm (axial image 197 of series 3). No acute consolidative airspace disease. No pleural effusions. Diffuse bronchial wall thickening with mild centrilobular and paraseptal emphysema.  Upper Abdomen: Unremarkable.  Musculoskeletal: There are no aggressive appearing lytic or blastic lesions noted in the visualized portions of the skeleton.  IMPRESSION: 1. Slow progressive enlargement of a mixed sub solid and solid lesion in the posterior aspect of the left lower lobe, highly concerning for possible primary bronchogenic adenocarcinoma, categorized as Lung-RADS 4BS, suspicious. Additional imaging evaluation or consultation with Pulmonology or Thoracic Surgery recommended. 2. The "S" modifier above refers to potentially clinically significant non lung cancer related findings. Specifically, there is aortic atherosclerosis, in addition to left main and 3 vessel coronary artery disease. Please note that although the presence of coronary artery calcium documents the presence of coronary artery disease, the severity of this disease and any potential stenosis cannot be assessed on this non-gated CT examination. Assessment for potential risk factor modification, dietary therapy or pharmacologic therapy may be warranted, if clinically indicated. 3. Mild diffuse bronchial wall thickening with mild centrilobular and paraseptal emphysema; imaging findings suggestive of underlying COPD.  These results will be called to the ordering clinician or representative by the Radiologist Assistant, and communication documented in the PACS or [employer].  Aortic Atherosclerosis (ICD10-I70.0) and Emphysema (ICD10-J43.9).   Electronically Signed   By: Vinnie Langton M.D.   On: 08/07/2020 13:25 NUCLEAR MEDICINE PET SKULL BASE TO THIGH  TECHNIQUE: 11.16 mCi F-18 FDG was injected intravenously. Full-ring PET imaging was performed from the skull base to thigh  after the radiotracer. CT data was obtained and used for attenuation correction and anatomic localization.  Fasting blood glucose: 103 mg/dl  COMPARISON:  Multiple prior chest CTs. The most recent is 08/06/2020  FINDINGS: Mediastinal blood pool activity: SUV max 2.23  Liver activity: SUV max NA  NECK: Choose 1  Incidental CT findings: none  CHEST: The left lower lobe ill-defined semi solid lesion demonstrates very slight FDG uptake with SUV max of 1.82.  14 mm left upper lobe ground-glass nodule demonstrates low level FDG uptake with SUV max of 1.66.  Vague elongated ground-glass opacity in the right upper lobe on image number  35/8 demonstrates minimal FDG uptake with SUV max of 1.14.  No worrisome solid pulmonary lesions.  No enlarged or hypermetabolic mediastinal or hilar lymph nodes.  No chest wall mass, supraclavicular or axillary adenopathy.  Incidental CT findings: Stable scattered aortic and coronary artery calcifications.  ABDOMEN/PELVIS: No abnormal hypermetabolic activity within the liver, pancreas, adrenal glands, or spleen. No hypermetabolic lymph nodes in the abdomen or pelvis.  Incidental CT findings: Scattered aortic calcifications. Small left inguinal hernia containing fat. Moderate prostatomegaly.  SKELETON: Mild background FDG uptake in the osseous structures is probably normal. There is a more focal and intense area of FDG uptake in the T11 vertebral body. I do not see any obvious CT correlate such as bone lesion or compression fracture. MRI thoracic spine without and with contrast may be helpful for further evaluation of this finding.  Incidental CT findings: none  IMPRESSION: 1. Low level FDG uptake in sub solid pulmonary lesions as detailed above. No significant hypermetabolism. 2. No enlarged or hypermetabolic mediastinal or hilar lymph nodes. 3. No significant findings in the abdomen/pelvis. 4. Moderate hypermetabolism  in the T11 vertebral body without CT correlate. MRI without and with contrast if possible may be helpful for further evaluation of this finding.   Electronically Signed   By: Marijo Sanes M.D.   On: 09/12/2020 13:39 I personally reviewed the CT and PET/CT findings. Groundglass opacity in the left lower lobe. No hilar or mediastinal adenopathy. PET shows hypermetabolism in T11.  Pulmonary function testing 09/13/2020 FVC 5.11 (89%) FEV1 3.85 (90%) FEV1 4.02 (94%) postbronchodilator DLCO 31.99 (99%)  Impression: Isaac Carrillo is a 70 year old man with a past medical history significant for tobacco abuse who has a groundglass opacity in the left lower lobe. On a recent follow-up low-dose CT scan he had some mild progression and also some new solid components. That led to a bronchoscopy. Brushings and needle aspiration showed atypical cells highly suspicious for probable adenocarcinoma. PET/CT showed some mild uptake in that area. There was some uptake in T11 but no CT correlate.   Findings are most consistent with adenocarcinoma.  The lesion is highly irregular but likely would follow into a T2, N0, stage IIa category.  We discussed potential treatment options including surgery and radiation. Surgery is the gold standard and is far likely to result in a cure particular for lesion of this type.  I recommended that we proceed with a robotic left lower lobectomy. I described the procedure in detail to Mr. Strine and his son. We discussed the general nature of the procedure including the need for general anesthesia, the incisions to be used, the use of a drainage tube postoperatively the expected hospital stay, and the overall recovery. I informed them of the indications, risks, benefits, and alternatives. They understand the risks include, but not limited to death, MI, DVT, PE, bleeding, possible need for transfusion, infection, prolonged air leak, cardiac arrhythmias, as well as the possibility  of other unforeseeable complications.  He understands accepts the risk of surgery.  There is the finding of T11 hypermetabolism on PET/CT. He is scheduled to have an MRI of the spine on 10/01/2020. I do not think that is metastatic disease from this lower lobe lesion. Likely a additional work-up can be done after his surgical resection.  Plan: MRI spine scheduled for 10/01/2020 Robotic left lower lobectomy on Monday 10/08/2020  Melrose Nakayama, MD [facility] [phone]

## 2020-09-19 NOTE — Telephone Encounter (Signed)
Spoke with the pt and notified needs labs  Labs already placed  Nothing further needed

## 2020-09-20 ENCOUNTER — Encounter: Payer: Self-pay | Admitting: *Deleted

## 2020-09-20 ENCOUNTER — Other Ambulatory Visit: Payer: Self-pay | Admitting: *Deleted

## 2020-09-20 DIAGNOSIS — C3432 Malignant neoplasm of lower lobe, left bronchus or lung: Secondary | ICD-10-CM

## 2020-09-24 ENCOUNTER — Other Ambulatory Visit (INDEPENDENT_AMBULATORY_CARE_PROVIDER_SITE_OTHER): Payer: Medicare HMO

## 2020-09-24 DIAGNOSIS — Z01818 Encounter for other preprocedural examination: Secondary | ICD-10-CM

## 2020-09-24 LAB — BUN: BUN: 19 mg/dL (ref 6–23)

## 2020-09-24 LAB — CREATININE, SERUM: Creatinine, Ser: 0.96 mg/dL (ref 0.40–1.50)

## 2020-10-01 ENCOUNTER — Other Ambulatory Visit: Payer: Self-pay

## 2020-10-01 ENCOUNTER — Ambulatory Visit (HOSPITAL_COMMUNITY)
Admission: RE | Admit: 2020-10-01 | Discharge: 2020-10-01 | Disposition: A | Discharge status: Home / Self Care | Payer: Medicare HMO | Source: Ambulatory Visit | Attending: Pulmonary Disease | Admitting: Pulmonary Disease

## 2020-10-01 DIAGNOSIS — C3492 Malignant neoplasm of unspecified part of left bronchus or lung: Secondary | ICD-10-CM

## 2020-10-01 DIAGNOSIS — S22080A Wedge compression fracture of T11-T12 vertebra, initial encounter for closed fracture: Secondary | ICD-10-CM | POA: Diagnosis not present

## 2020-10-01 DIAGNOSIS — D1809 Hemangioma of other sites: Secondary | ICD-10-CM | POA: Diagnosis not present

## 2020-10-01 DIAGNOSIS — M5134 Other intervertebral disc degeneration, thoracic region: Secondary | ICD-10-CM | POA: Diagnosis not present

## 2020-10-01 DIAGNOSIS — M5124 Other intervertebral disc displacement, thoracic region: Secondary | ICD-10-CM | POA: Diagnosis not present

## 2020-10-01 MED ORDER — GADOBUTROL 1 MMOL/ML IV SOLN
10.0000 mL | Freq: Once | INTRAVENOUS | Status: AC | PRN
  Administered 2020-10-01: 10 mL via INTRAVENOUS

## 2020-10-03 NOTE — Progress Notes (Signed)
[facility] # [place], [address] [place] [address] Phone: [phone] Fax: [phone]      Your procedure is scheduled on February 7  Report to [facility] Main Entrance "A" at 1015 A.M., and check in at the Admitting office.  Call this number if you have problems the morning of surgery:  [phone]  Call [phone] if you have any questions prior to your surgery date Monday-Friday 8am-4pm    Remember:  Do not eat or drink after midnight the night before your surgery     Take these medicines the morning of surgery with A SIP OF WATER  atorvastatin (LIPITOR) citalopram (CELEXA)    As of today, STOP taking any Aspirin (unless otherwise instructed by your surgeon) Aleve, Naproxen, Ibuprofen, Motrin, Advil, Goody's, BC's, all herbal medications, fish oil, and all vitamins.                      Do not wear jewelry            Do not wear lotions, powders, colognes, or deodorant.            Men may shave face and neck.            Do not bring valuables to the hospital.            [facility] is not responsible for any belongings or valuables.  Do NOT Smoke (Tobacco/Vaping) or drink Alcohol 24 hours prior to your procedure If you use a CPAP at night, you may bring all equipment for your overnight stay.   Contacts, glasses, dentures or bridgework may not be worn into surgery.      For patients admitted to the hospital, discharge time will be determined by your treatment team.   Patients discharged the day of surgery will not be allowed to drive home, and someone needs to stay with them for 24 hours.    Special instructions:   [place]- Preparing For Surgery  Before surgery, you can play an important role. Because skin is not sterile, your skin needs to be as free of germs as possible. You can reduce the number of germs on your skin by washing with CHG (chlorahexidine gluconate) Soap before surgery.  CHG is an  antiseptic cleaner which kills germs and bonds with the skin to continue killing germs even after washing.    Oral Hygiene is also important to reduce your risk of infection.  Remember - BRUSH YOUR TEETH THE MORNING OF SURGERY WITH YOUR REGULAR TOOTHPASTE  Please do not use if you have an allergy to CHG or antibacterial soaps. If your skin becomes reddened/irritated stop using the CHG.  Do not shave (including legs and underarms) for at least 48 hours prior to first CHG shower. It is OK to shave your face.  Please follow these instructions carefully.   1. Shower the NIGHT BEFORE SURGERY and the MORNING OF SURGERY with CHG Soap.   2. If you chose to wash your hair, wash your hair first as usual with your normal shampoo.  3. After you shampoo, rinse your hair and body thoroughly to remove the shampoo.  4. Use CHG as you would any other liquid soap. You can apply CHG directly to the skin and wash gently with a scrungie or a clean washcloth.   5. Apply the CHG Soap to your body ONLY FROM THE NECK DOWN.  Do not use on  open wounds or open sores. Avoid contact with your eyes, ears, mouth and genitals (private parts). Wash Face and genitals (private parts)  with your normal soap.   6. Wash thoroughly, paying special attention to the area where your surgery will be performed.  7. Thoroughly rinse your body with warm water from the neck down.  8. DO NOT shower/wash with your normal soap after using and rinsing off the CHG Soap.  9. Pat yourself dry with a CLEAN TOWEL.  10. Wear CLEAN PAJAMAS to bed the night before surgery  11. Place CLEAN SHEETS on your bed the night of your first shower and DO NOT SLEEP WITH PETS.   Day of Surgery: Wear Clean/Comfortable clothing the morning of surgery Do not apply any deodorants/lotions.   Remember to brush your teeth WITH YOUR REGULAR TOOTHPASTE.   Please read over the following fact sheets that you were given.

## 2020-10-04 ENCOUNTER — Other Ambulatory Visit: Payer: Self-pay | Admitting: *Deleted

## 2020-10-04 ENCOUNTER — Inpatient Hospital Stay (HOSPITAL_COMMUNITY)
Admission: RE | Admit: 2020-10-04 | Discharge: 2020-10-04 | Disposition: A | Discharge status: Home / Self Care | Payer: Medicare HMO | Source: Ambulatory Visit

## 2020-10-04 ENCOUNTER — Telehealth: Payer: Self-pay | Admitting: Thoracic Surgery (Cardiothoracic Vascular Surgery)

## 2020-10-04 ENCOUNTER — Other Ambulatory Visit: Payer: Self-pay | Admitting: Thoracic Surgery (Cardiothoracic Vascular Surgery)

## 2020-10-04 ENCOUNTER — Ambulatory Visit
Admission: RE | Admit: 2020-10-04 | Discharge: 2020-10-04 | Disposition: A | Discharge status: Home / Self Care | Payer: Medicare HMO | Source: Ambulatory Visit | Attending: Thoracic Surgery (Cardiothoracic Vascular Surgery) | Admitting: Thoracic Surgery (Cardiothoracic Vascular Surgery)

## 2020-10-04 ENCOUNTER — Other Ambulatory Visit (HOSPITAL_COMMUNITY): Payer: Medicare HMO

## 2020-10-04 ENCOUNTER — Other Ambulatory Visit: Payer: Self-pay

## 2020-10-04 DIAGNOSIS — C3432 Malignant neoplasm of lower lobe, left bronchus or lung: Secondary | ICD-10-CM

## 2020-10-04 DIAGNOSIS — S22080A Wedge compression fracture of T11-T12 vertebra, initial encounter for closed fracture: Secondary | ICD-10-CM | POA: Diagnosis not present

## 2020-10-04 HISTORY — PX: IR RADIOLOGIST EVAL & MGMT: IMG5224

## 2020-10-04 NOTE — Consult Note (Signed)
Chief Complaint: Concern for metastatic lung cancer, now with symptomatic potentially malignant T11 compression fracture   Referring Physician(s): Hendrickson,Steven C  History of Present Illness: Isaac Carrillo is a 70 y.o. male with past medical history significant for COPD smoking who was referred for image guided biopsy, Osteocool and cement augmentation of potentially malignant/pathologic fracture involving the superior endplate of the I96 vertebral body.  Patient was initially evaluated by Dr. Roxan Hockey lfor ung resection in regards to progression of mixed subsolid and solid lesion involving the left lower lobe on lung cancer screening CT performed 08/07/2020 which subsequently underwent bronchoscopic biopsy with tissue diagnosis compatible with atypical cells suspicious for malignancy with morphology favoring adenocarcinoma.    Subsequent PET/CT performed 09/12/2020 demonstrated hypermetabolism within the T11 vertebral body, further evaluated on dedicated lumbar thoracic spine MRI performed 10/01/2020 which demonstrated mild (approximately 25%) compression deformity with enhancement worrisome for potential malignancy.  As such, request made for image guided biopsy as well as empiric treatment with RF ablation (Ostecool) and cement augmentation.  The plan per Dr. Roxan Hockey is that if is the biopsy is negative for metastatic disease, he would proceed with surgical resection.  Patient states he has a long history of intermittent back pain though this is primarily associated with the lower lumbar spine however approximately 2 weeks ago he developed the acute onset of mid/lower thoracic back pain following lifting of a heavy object at work.  Patient is employed at [employer].  Patient is otherwise in his baseline state of well health.  No chest pain or shortness of breath.  No hemoptysis.  No lower extremity numbness or weakness.  No change in bowel or bladder functions.   Past Medical  History:  Diagnosis Date  . Anxiety   . Centrilobular emphysema ([place])   . COPD (chronic obstructive pulmonary disease) ([place])   . Former smoker   . History of kidney stones     x2 passed    Past Surgical History:  Procedure Laterality Date  . BRONCHIAL BIOPSY  08/28/2020   Procedure: BRONCHIAL BIOPSIES;  Surgeon: Garner Nash, DO;  Location: [facility];  Service: Pulmonary;;  . BRONCHIAL BRUSHINGS  08/28/2020   Procedure: BRONCHIAL BRUSHINGS;  Surgeon: Garner Nash, DO;  Location: [facility];  Service: Pulmonary;;  . BRONCHIAL NEEDLE ASPIRATION BIOPSY  08/28/2020   Procedure: BRONCHIAL NEEDLE ASPIRATION BIOPSIES;  Surgeon: Garner Nash, DO;  Location: [facility];  Service: Pulmonary;;  . BRONCHIAL WASHINGS  08/28/2020   Procedure: BRONCHIAL WASHINGS;  Surgeon: Garner Nash, DO;  Location: [facility];  Service: Pulmonary;;  . DENTAL SURGERY    . ENDOBRONCHIAL ULTRASOUND  08/28/2020   Procedure: ENDOBRONCHIAL ULTRASOUND;  Surgeon: Garner Nash, DO;  Location: [facility];  Service: Pulmonary;;  . VIDEO BRONCHOSCOPY WITH ENDOBRONCHIAL NAVIGATION Left 08/28/2020   Procedure: VIDEO BRONCHOSCOPY WITH ENDOBRONCHIAL NAVIGATION;  Surgeon: Garner Nash, DO;  Location: [facility];  Service: Pulmonary;  Laterality: Left;    Allergies: Patient has no known allergies.  Medications: Prior to Admission medications   Medication Sig Start Date End Date Taking? Authorizing Provider  atorvastatin (LIPITOR) 20 MG tablet Take 20 mg by mouth daily. 08/10/20   [provider]  citalopram (CELEXA) 10 MG tablet Take 10 mg by mouth daily. 10 MG + 20 MG=30 MG DAILY DOSAGE 05/12/20   [provider]  citalopram (CELEXA) 20 MG tablet Take 20 mg by mouth daily. 20 MG + 10 MG=30 MG DAILY DOSAGE 07/30/20   [provider]  docusate sodium (COLACE) 100 MG capsule Take 100 mg by mouth daily as needed for mild constipation.    [provider]   ibuprofen (ADVIL,MOTRIN) 200 MG tablet Take 800 mg by mouth every 8 (eight) hours as needed for mild pain.    [provider]  sildenafil (REVATIO) 20 MG tablet Take 40-80 mg by mouth daily as needed (erectile dysfunction.).    [provider]     Family History  Problem Relation Age of Onset  . Heart disease Mother   . Heart disease Father     Social History   Socioeconomic History  . Marital status: Single    Spouse name: Not on file  . Number of children: Not on file  . Years of education: Not on file  . Highest education level: Not on file  Occupational History  . Not on file  Tobacco Use  . Smoking status: Former Smoker    Packs/day: 2.50    Years: 40.00    Pack years: 100.00    Types: Cigarettes    Quit date: 03/01/2016    Years since quitting: 4.5  . Smokeless tobacco: Never Used  . Tobacco comment: Quit smoking 03/2016  Vaping Use  . Vaping Use: Never used  Substance and Sexual Activity  . Alcohol use: No  . Drug use: Never  . Sexual activity: Not on file  Other Topics Concern  . Not on file  Social History Narrative  . Not on file   Social Determinants of Health   Financial Resource Strain: Not on file  Food Insecurity: Not on file  Transportation Needs: Not on file  Physical Activity: Not on file  Stress: Not on file  Social Connections: Not on file    ECOG Status: 1 - Symptomatic but completely ambulatory  Review of Systems  Review of Systems: A 12 point ROS discussed and pertinent positives are indicated in the HPI above.  All other systems are negative.  Physical Exam No direct physical exam was performed (except for noted visual exam findings with Video Visits).   Vital Signs: There were no vitals taken for this visit.  Imaging:  The following examinations were reviewed in detail:  Chest CT - 08/06/2020 PET/CT - 09/12/2020 Thoracic spine MRI-10/01/2020.  Thoracic spine MRI demonstrates a mild (approximate 25%)  compression deformity involving the of the T11 vertebral body with persistent fracture line, STIR signal abnormality and indeterminate enhancement pattern.  No retropulsion posterior element involvement or epidural hematoma.  MR THORACIC SPINE W WO CONTRAST  Result Date: 10/02/2020 CLINICAL DATA:  Initial evaluation for history of non-small cell lung cancer, priest treatment staging. EXAM: MRI THORACIC WITHOUT AND WITH CONTRAST TECHNIQUE: Multiplanar and multiecho pulse sequences of the thoracic spine were obtained without and with intravenous contrast. CONTRAST:  58mL GADAVIST GADOBUTROL 1 MMOL/ML IV SOLN COMPARISON:  Prior PET-CT from 09/12/2020. FINDINGS: Alignment: Vertebral bodies normally aligned with preservation of the normal thoracic kyphosis. No listhesis or static subluxation. Vertebrae: There is an acute compression fracture involving the superior endplate of F63 with up to 20% height loss without bony retropulsion. There is abnormal T1 hypointensity the with STIR hyperintensity and enhancement within the underlying T11 vertebral body, out of proportion as is normally seen in the setting of a benign mechanical compression fracture, suggesting that there is an underlying osseous metastasis in that this is pathologic in nature. No extra osseous or epidural extension of tumor. No other visible metastatic lesions seen within the thoracic  spine. Underlying bone marrow signal intensity otherwise normal. Few scattered benign hemangiomata noted. Cord: Normal signal and morphology. No abnormal enhancement. No epidural tumor. Paraspinal and other soft tissues: Paraspinous soft tissues within normal limits. Disc levels: Minimal for age multilevel degenerative disc disease with minimal disc bulging at T7-8 through T11-12. No significant spinal stenosis or evidence for neural impingement. IMPRESSION: 1. Acute compression fracture involving the superior endplate of E95 with up to 20% height loss without bony  retropulsion. This is favored to be pathologic in nature given the degree of underlying edema and enhancement within the T11 vertebral body. An underlying osseous metastasis is suspected. 2. No other evidence for metastatic disease within the thoracic spine. Electronically Signed   By: Jeannine Boga M.D.   On: 10/02/2020 03:44   NM PET Image Initial (PI) Skull Base To Thigh  Result Date: 09/12/2020 CLINICAL DATA:  Initial treatment strategy for non-small cell lung cancer. Recent bronchoscopic biopsy 08/28/2020. EXAM: NUCLEAR MEDICINE PET SKULL BASE TO THIGH TECHNIQUE: 11.16 mCi F-18 FDG was injected intravenously. Full-ring PET imaging was performed from the skull base to thigh after the radiotracer. CT data was obtained and used for attenuation correction and anatomic localization. Fasting blood glucose: 103 mg/dl COMPARISON:  Multiple prior chest CTs. The most recent is 08/06/2020 FINDINGS: Mediastinal blood pool activity: SUV max 2.23 Liver activity: SUV max NA NECK: Choose 1 Incidental CT findings: none CHEST: The left lower lobe ill-defined semi solid lesion demonstrates very slight FDG uptake with SUV max of 1.82. 14 mm left upper lobe ground-glass nodule demonstrates low level FDG uptake with SUV max of 1.66. Vague elongated ground-glass opacity in the right upper lobe on image number 35/8 demonstrates minimal FDG uptake with SUV max of 1.14. No worrisome solid pulmonary lesions. No enlarged or hypermetabolic mediastinal or hilar lymph nodes. No chest wall mass, supraclavicular or axillary adenopathy. Incidental CT findings: Stable scattered aortic and coronary artery calcifications. ABDOMEN/PELVIS: No abnormal hypermetabolic activity within the liver, pancreas, adrenal glands, or spleen. No hypermetabolic lymph nodes in the abdomen or pelvis. Incidental CT findings: Scattered aortic calcifications. Small left inguinal hernia containing fat. Moderate prostatomegaly. SKELETON: Mild background FDG  uptake in the osseous structures is probably normal. There is a more focal and intense area of FDG uptake in the T11 vertebral body. I do not see any obvious CT correlate such as bone lesion or compression fracture. MRI thoracic spine without and with contrast may be helpful for further evaluation of this finding. Incidental CT findings: none IMPRESSION: 1. Low level FDG uptake in sub solid pulmonary lesions as detailed above. No significant hypermetabolism. 2. No enlarged or hypermetabolic mediastinal or hilar lymph nodes. 3. No significant findings in the abdomen/pelvis. 4. Moderate hypermetabolism in the T11 vertebral body without CT correlate. MRI without and with contrast if possible may be helpful for further evaluation of this finding. Electronically Signed   By: Marijo Sanes M.D.   On: 09/12/2020 13:39    Labs:  CBC: No results for input(s): WBC, HGB, HCT, PLT in the last 8760 hours.  COAGS: No results for input(s): INR, APTT in the last 8760 hours.  BMP: Recent Labs    09/24/20 1135  BUN 19  CREATININE 0.96    LIVER FUNCTION TESTS: No results for input(s): BILITOT, AST, ALT, ALKPHOS, PROT, ALBUMIN in the last 8760 hours.  TUMOR MARKERS: No results for input(s): AFPTM, CEA, CA199, CHROMGRNA in the last 8760 hours.  Assessment and Plan:  Jonta Gastineau is a  70 y.o. male with past medical history significant for COPD smoking who was referred for image guided biopsy, Osteocool and cement augmentation of potentially malignant/pathologic fracture involving the superior endplate of the S17 vertebral body.  Patient states he has a long history of intermittent back pain though this is primarily associated with the lower lumbar spine however approximately 2 weeks ago he developed the acute onset of mid/lower thoracic back pain following lifting of a heavy object at work.  Patient is employed at [employer].  Patient is otherwise in his baseline state of well health.  The patient is not on  anticoagulation.  The following examinations were reviewed in detail:  Chest CT - 08/06/2020 PET/CT - 09/12/2020 Thoracic spine MRI-10/01/2020.  Thoracic spine MRI demonstrates a mild (approximate 25%) compression deformity involving the of the T11 vertebral body with persistent fracture line, STIR signal abnormality and indeterminate enhancement pattern.  No retropulsion posterior element involvement or epidural hematoma.  Based on review of above imaging, I feel the patient is an excellent candidate for image guided biopsy, RF ablation (Osteocool) and kyphoplasty both for tissue diagnostic and symptomatic purposes and as such, the risks and benefits were discussed with the patient including, but not limited to education regarding the natural healing process of compression fractures without intervention, bleeding, infection, cement migration which may cause spinal cord damage, paralysis, pulmonary embolism or even death.  All of the patient's questions were answered, patient is agreeable to proceed.  While the patient would prefer for me to perform the procedure, he is willing to have one of my capable interventional radiology.  If this would mean the procedure would be a more expeditious manner.  Plan: - [employer] authorization for image guided biopsy, RF ablation and kyphoplasty of malignant/pathologic T11 compression fracture. - Ultimately, the procedure will be performed either at [facility] (with either myself or one of my capable peripheral interventional radiology partners) or at [facility] with Dr. Estanislado Pandy (neurointerventional radiologist).  The procedure will be performed on an outpatient basis.   Thank you for this interesting consult.  I greatly enjoyed meeting Isaac Carrillo and look forward to participating in their care.  A copy of this report was sent to the requesting provider on this date.  Electronically Signed: Sandi Mariscal 10/04/2020, 3:22 PM   I spent a  total of 15 Minutes in remote  clinical consultation, greater than 50% of which was counseling/coordinating care for symptomatic potentially malignant T11 compression fracture.    Visit type: Audio only (telephone). Audio (no video) only due to patient's lack of internet/smartphone capability. Alternative for in-person consultation at [facility], [address], [place], [place]. This visit type was conducted due to national recommendations for restrictions regarding the COVID-19 Pandemic (e.g. social distancing).  This format is felt to be most appropriate for this patient at this time.  All issues noted in this document were discussed and addressed.

## 2020-10-04 NOTE — Progress Notes (Signed)
The proposed treatment discussed in cancer conference is for discussion purpose only and is not a binding recommendation. The patient was not physically examined nor present for their treatment options. Therefore, final treatment plans cannot be decided.  ?

## 2020-10-04 NOTE — Telephone Encounter (Signed)
      [address]       [place] [address]             [phone]     \ I called Isaac Carrillo to discuss the results of his T spine MR  MRI THORACIC WITHOUT AND WITH CONTRAST  TECHNIQUE: Multiplanar and multiecho pulse sequences of the thoracic spine were obtained without and with intravenous contrast.  CONTRAST:  5mL GADAVIST GADOBUTROL 1 MMOL/ML IV SOLN  COMPARISON:  Prior PET-CT from 09/12/2020.  FINDINGS: Alignment: Vertebral bodies normally aligned with preservation of the normal thoracic kyphosis. No listhesis or static subluxation.  Vertebrae: There is an acute compression fracture involving the superior endplate of S82 with up to 20% height loss without bony retropulsion. There is abnormal T1 hypointensity the with STIR hyperintensity and enhancement within the underlying T11 vertebral body, out of proportion as is normally seen in the setting of a benign mechanical compression fracture, suggesting that there is an underlying osseous metastasis in that this is pathologic in nature. No extra osseous or epidural extension of tumor. No other visible metastatic lesions seen within the thoracic spine. Underlying bone marrow signal intensity otherwise normal. Few scattered benign hemangiomata noted.  Cord: Normal signal and morphology. No abnormal enhancement. No epidural tumor.  Paraspinal and other soft tissues: Paraspinous soft tissues within normal limits.  Disc levels:  Minimal for age multilevel degenerative disc disease with minimal disc bulging at T7-8 through T11-12. No significant spinal stenosis or evidence for neural impingement.  IMPRESSION: 1. Acute compression fracture involving the superior endplate of L07 with up to 20% height loss without bony retropulsion. This is favored to be pathologic in nature given the degree of underlying edema and enhancement within the T11 vertebral body. An underlying osseous metastasis is  suspected. 2. No other evidence for metastatic disease within the thoracic spine.   Electronically Signed   By: Jeannine Boga M.D.   On: 10/02/2020 03:44  We reviewed the MR in out [place] conference this AM Consensus opinion was that T spine needs to addressed prior to lung resection.  Dr. Francena Hanly of IR will arrange a biopsy and possible ablation/ cement   If biopsy negative will proceed with lobectomy  Remo Lipps C. Roxan Hockey, MD [facility] [phone]

## 2020-10-05 ENCOUNTER — Other Ambulatory Visit: Payer: Self-pay | Admitting: Interventional Radiology

## 2020-10-08 ENCOUNTER — Encounter (HOSPITAL_COMMUNITY): Admission: RE | Payer: Self-pay | Source: Home / Self Care

## 2020-10-08 ENCOUNTER — Inpatient Hospital Stay (HOSPITAL_COMMUNITY)
Admission: RE | Admit: 2020-10-08 | Payer: Medicare HMO | Source: Home / Self Care | Admitting: Thoracic Surgery (Cardiothoracic Vascular Surgery)

## 2020-10-08 SURGERY — LOBECTOMY, LUNG, ROBOT-ASSISTED, USING VATS
Anesthesia: General | Site: Chest | Laterality: Left

## 2020-10-11 ENCOUNTER — Other Ambulatory Visit (HOSPITAL_COMMUNITY): Payer: Self-pay | Admitting: Interventional Radiology

## 2020-10-11 DIAGNOSIS — C3432 Malignant neoplasm of lower lobe, left bronchus or lung: Secondary | ICD-10-CM

## 2020-10-11 NOTE — Progress Notes (Signed)
Pt. Is scheduled for treatment 2/14 with IR.

## 2020-10-12 ENCOUNTER — Other Ambulatory Visit: Payer: Self-pay | Admitting: Radiology

## 2020-10-12 ENCOUNTER — Other Ambulatory Visit: Payer: Self-pay | Admitting: Student

## 2020-10-15 ENCOUNTER — Other Ambulatory Visit: Payer: Self-pay

## 2020-10-15 ENCOUNTER — Other Ambulatory Visit (HOSPITAL_COMMUNITY): Payer: Self-pay | Admitting: Interventional Radiology

## 2020-10-15 ENCOUNTER — Ambulatory Visit (HOSPITAL_COMMUNITY)
Admission: RE | Admit: 2020-10-15 | Discharge: 2020-10-15 | Disposition: A | Discharge status: Home / Self Care | Payer: Medicare HMO | Source: Ambulatory Visit | Attending: Interventional Radiology | Admitting: Interventional Radiology

## 2020-10-15 ENCOUNTER — Encounter (HOSPITAL_COMMUNITY): Payer: Self-pay

## 2020-10-15 DIAGNOSIS — M8448XA Pathological fracture, other site, initial encounter for fracture: Secondary | ICD-10-CM | POA: Diagnosis not present

## 2020-10-15 DIAGNOSIS — S22080A Wedge compression fracture of T11-T12 vertebra, initial encounter for closed fracture: Secondary | ICD-10-CM | POA: Diagnosis not present

## 2020-10-15 DIAGNOSIS — F17200 Nicotine dependence, unspecified, uncomplicated: Secondary | ICD-10-CM | POA: Diagnosis not present

## 2020-10-15 DIAGNOSIS — C3490 Malignant neoplasm of unspecified part of unspecified bronchus or lung: Secondary | ICD-10-CM | POA: Diagnosis not present

## 2020-10-15 DIAGNOSIS — C3432 Malignant neoplasm of lower lobe, left bronchus or lung: Secondary | ICD-10-CM

## 2020-10-15 DIAGNOSIS — M4854XA Collapsed vertebra, not elsewhere classified, thoracic region, initial encounter for fracture: Secondary | ICD-10-CM | POA: Diagnosis not present

## 2020-10-15 DIAGNOSIS — Z79899 Other long term (current) drug therapy: Secondary | ICD-10-CM | POA: Insufficient documentation

## 2020-10-15 DIAGNOSIS — R69 Illness, unspecified: Secondary | ICD-10-CM | POA: Diagnosis not present

## 2020-10-15 HISTORY — PX: IR KYPHO THORACIC WITH BONE BIOPSY: IMG5518

## 2020-10-15 HISTORY — PX: IR BONE TUMOR(S)RF ABLATION: IMG2284

## 2020-10-15 LAB — CBC WITH DIFFERENTIAL/PLATELET
Abs Immature Granulocytes: 0.07 10*3/uL (ref 0.00–0.07)
Basophils Absolute: 0.1 10*3/uL (ref 0.0–0.1)
Basophils Relative: 1 %
Eosinophils Absolute: 0.3 10*3/uL (ref 0.0–0.5)
Eosinophils Relative: 3 %
HCT: 44.4 % (ref 39.0–52.0)
Hemoglobin: 15.4 g/dL (ref 13.0–17.0)
Immature Granulocytes: 1 %
Lymphocytes Relative: 25 %
Lymphs Abs: 2.3 10*3/uL (ref 0.7–4.0)
MCH: 32.4 pg (ref 26.0–34.0)
MCHC: 34.7 g/dL (ref 30.0–36.0)
MCV: 93.5 fL (ref 80.0–100.0)
Monocytes Absolute: 1 10*3/uL (ref 0.1–1.0)
Monocytes Relative: 11 %
Neutro Abs: 5.5 10*3/uL (ref 1.7–7.7)
Neutrophils Relative %: 59 %
Platelets: 123 10*3/uL — ABNORMAL LOW (ref 150–400)
RBC: 4.75 MIL/uL (ref 4.22–5.81)
RDW: 13.2 % (ref 11.5–15.5)
WBC: 9.2 10*3/uL (ref 4.0–10.5)
nRBC: 0 % (ref 0.0–0.2)

## 2020-10-15 LAB — PROTIME-INR
INR: 1 (ref 0.8–1.2)
Prothrombin Time: 12.9 seconds (ref 11.4–15.2)

## 2020-10-15 LAB — BASIC METABOLIC PANEL
Anion gap: 10 (ref 5–15)
BUN: 21 mg/dL (ref 8–23)
CO2: 22 mmol/L (ref 22–32)
Calcium: 9.2 mg/dL (ref 8.9–10.3)
Chloride: 105 mmol/L (ref 98–111)
Creatinine, Ser: 1.1 mg/dL (ref 0.61–1.24)
GFR, Estimated: >60 mL/min (ref >60)
Glucose, Bld: 109 mg/dL — ABNORMAL HIGH (ref 70–99)
Potassium: 4 mmol/L (ref 3.5–5.1)
Sodium: 137 mmol/L (ref 135–145)

## 2020-10-15 MED ORDER — SODIUM CHLORIDE 0.9 % IV SOLN: INTRAVENOUS | Status: DC

## 2020-10-15 MED ORDER — FENTANYL CITRATE (PF) 100 MCG/2ML IJ SOLN
INTRAMUSCULAR | Status: AC | PRN
  Administered 2020-10-15: 50 ug via INTRAVENOUS
  Administered 2020-10-15: 25 ug via INTRAVENOUS
  Administered 2020-10-15: 50 ug via INTRAVENOUS

## 2020-10-15 MED ORDER — IOHEXOL 300 MG/ML  SOLN
50.0000 mL | Freq: Once | INTRAMUSCULAR | Status: AC | PRN
  Administered 2020-10-15: 5 mL

## 2020-10-15 MED ORDER — MIDAZOLAM HCL 2 MG/2ML IJ SOLN
INTRAMUSCULAR | Status: AC
  Filled 2020-10-15: qty 6

## 2020-10-15 MED ORDER — LIDOCAINE HCL (PF) 1 % IJ SOLN
INTRAMUSCULAR | Status: AC
  Filled 2020-10-15: qty 30

## 2020-10-15 MED ORDER — OXYCODONE-ACETAMINOPHEN 5-325 MG PO TABS: 1.0000 | ORAL_TABLET | ORAL | Status: DC | PRN

## 2020-10-15 MED ORDER — KETOROLAC TROMETHAMINE 60 MG/2ML IM SOLN
INTRAMUSCULAR | Status: AC | PRN
  Administered 2020-10-15: 30 mg via INTRAMUSCULAR

## 2020-10-15 MED ORDER — FENTANYL CITRATE (PF) 100 MCG/2ML IJ SOLN
INTRAMUSCULAR | Status: AC
  Filled 2020-10-15: qty 4

## 2020-10-15 MED ORDER — OXYCODONE-ACETAMINOPHEN 7.5-325 MG PO TABS
1.0000 | ORAL_TABLET | ORAL | Status: DC | PRN
Start: 2020-10-15 — End: 2020-10-15

## 2020-10-15 MED ORDER — MIDAZOLAM HCL 2 MG/2ML IJ SOLN
INTRAMUSCULAR | Status: AC | PRN
  Administered 2020-10-15: 1 mg via INTRAVENOUS
  Administered 2020-10-15 (×2): 0.5 mg via INTRAVENOUS
  Administered 2020-10-15: 1 mg via INTRAVENOUS
  Administered 2020-10-15 (×3): 0.5 mg via INTRAVENOUS

## 2020-10-15 MED ORDER — CEFAZOLIN SODIUM-DEXTROSE 2-4 GM/100ML-% IV SOLN
2.0000 g | INTRAVENOUS | Status: AC
  Administered 2020-10-15: 2 g via INTRAVENOUS
  Filled 2020-10-15: qty 100

## 2020-10-15 MED ORDER — DIPHENHYDRAMINE HCL 50 MG/ML IJ SOLN
INTRAMUSCULAR | Status: AC | PRN
  Administered 2020-10-15: 25 mg via INTRAVENOUS

## 2020-10-15 MED ORDER — DIPHENHYDRAMINE HCL 50 MG/ML IJ SOLN
INTRAMUSCULAR | Status: AC
  Filled 2020-10-15: qty 1

## 2020-10-15 MED ORDER — KETOROLAC TROMETHAMINE 30 MG/ML IJ SOLN
INTRAMUSCULAR | Status: AC
  Filled 2020-10-15: qty 1

## 2020-10-15 MED ORDER — OXYCODONE HCL 5 MG PO TABS: 2.5000 mg | ORAL_TABLET | ORAL | Status: DC | PRN

## 2020-10-15 MED ORDER — LIDOCAINE HCL (PF) 1 % IJ SOLN
INTRAMUSCULAR | Status: AC | PRN
  Administered 2020-10-15 (×2): 10 mL via INTRADERMAL

## 2020-10-15 MED ORDER — CEFAZOLIN SODIUM-DEXTROSE 2-4 GM/100ML-% IV SOLN
INTRAVENOUS | Status: AC
  Filled 2020-10-15: qty 100

## 2020-10-15 NOTE — Sedation Documentation (Addendum)
Patient scratching nose and chin frequently. 25mg  Benadryl given IV per Dr Laurence Ferrari. See MAR.

## 2020-10-15 NOTE — Procedures (Signed)
Interventional Radiology Procedure Note  Procedure: T11 transpedicular biopsy, RFA and cement augmentation with KP.   Complications: None  Estimated Blood Loss: None  Recommendations: - Bedrest x 3 hrs - DC home   Signed,  Criselda Peaches, MD

## 2020-10-15 NOTE — H&P (Signed)
Referring Physician(s): Hendrickson,S  Supervising Physician: Jacqulynn Cadet  Patient Status:  [facility]  Chief Complaint:  Back pain,T 11 fracture   Subjective: Patient familiar to IR service from tele consultation with Dr. 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 He is a 70 y.o. male with past medical history significant for COPD/ smoking who was referred for image guided biopsy, Osteocool and cement augmentation of potentially malignant/pathologic fracture involving the superior endplate of the Q75 vertebral body.  Patient was initially evaluated by Dr. Roxan Hockey for ung resection in regards to progression of mixed subsolid and solid lesion involving the left lower lobe on lung cancer screening CT performed 08/07/2020 which subsequently underwent bronchoscopic biopsy with tissue diagnosis compatible with atypical cells suspicious for malignancy with morphology favoring adenocarcinoma.    Subsequent PET/CT performed 09/12/2020 demonstrated hypermetabolism within the T11 vertebral body, further evaluated on dedicated lumbar thoracic spine MRI performed 10/01/2020 which demonstrated mild (approximately 25%) compression deformity with enhancement worrisome for potential malignancy.  Patient states he has a long history of intermittent back pain though this is primarily associated with the lower lumbar spine however approximately 2 weeks ago he developed the acute onset of mid/lower thoracic back pain following lifting of a heavy object at work.  Patient is employed at [employer].  Pt was deemed an appropriate candidate for osteocool ablation/kyphoplasty/biopsy of T11 compression fracture and presents today for the procedure. He denies fever,HA,CP, dyspnea, cough, abd pain,N/V , bleeding, LE paresthesias, bowel/bladder incontinence. He does have some constipation.    Past Medical History:  Diagnosis Date  . Anxiety   .  Centrilobular emphysema ([place])    Pt denies  . COPD (chronic obstructive pulmonary disease) ([place])    Pt denies  . Former smoker   . History of kidney stones     x2 passed   Past Surgical History:  Procedure Laterality Date  . BRONCHIAL BIOPSY  08/28/2020   Procedure: BRONCHIAL BIOPSIES;  Surgeon: Garner Nash, DO;  Location: [facility];  Service: Pulmonary;;  . BRONCHIAL BRUSHINGS  08/28/2020   Procedure: BRONCHIAL BRUSHINGS;  Surgeon: Garner Nash, DO;  Location: [facility];  Service: Pulmonary;;  . BRONCHIAL NEEDLE ASPIRATION BIOPSY  08/28/2020   Procedure: BRONCHIAL NEEDLE ASPIRATION BIOPSIES;  Surgeon: Garner Nash, DO;  Location: [facility];  Service: Pulmonary;;  . BRONCHIAL WASHINGS  08/28/2020   Procedure: BRONCHIAL WASHINGS;  Surgeon: Garner Nash, DO;  Location: [facility];  Service: Pulmonary;;  . DENTAL SURGERY    . ENDOBRONCHIAL ULTRASOUND  08/28/2020   Procedure: ENDOBRONCHIAL ULTRASOUND;  Surgeon: Garner Nash, DO;  Location: [facility];  Service: Pulmonary;;  . IR RADIOLOGIST EVAL & MGMT  10/04/2020  . VIDEO BRONCHOSCOPY WITH ENDOBRONCHIAL NAVIGATION Left 08/28/2020   Procedure: VIDEO BRONCHOSCOPY WITH ENDOBRONCHIAL NAVIGATION;  Surgeon: Garner Nash, DO;  Location: [facility];  Service: Pulmonary;  Laterality: Left;      Allergies: Patient has no known allergies.  Medications: Prior to Admission medications   Medication Sig Start Date End Date Taking? Authorizing Provider  atorvastatin (LIPITOR) 20 MG tablet Take 20 mg by mouth daily. 08/10/20   [provider]  citalopram (CELEXA) 10 MG tablet Take 10 mg by mouth daily. 10 MG + 20 MG=30 MG DAILY DOSAGE 05/12/20   [provider]  citalopram (CELEXA) 20 MG tablet Take 20 mg by mouth daily. 20 MG + 10 MG=30 MG DAILY DOSAGE 07/30/20   [provider]  docusate sodium (COLACE) 100 MG capsule Take 100 mg by mouth daily as needed for mild constipation.     [provider]  ibuprofen (ADVIL,MOTRIN) 200 MG tablet Take 800 mg by mouth every 8 (eight) hours as needed for mild pain.    [provider]  sildenafil (REVATIO) 20 MG tablet Take 40-80 mg by mouth daily as needed (erectile dysfunction.).    [provider]     Vital Signs: Blood pressure 135/82, temp 98.4, heart rate 64, respirations 18, O2 sat 98% room air    Physical Exam awake/alert; chest- CTA bilat; heart-RRR; abd- soft,+BS,NT; no LE edema; T11 paravertebral tenderness  Imaging: No results found.  Labs:  CBC: No results for input(s): WBC, HGB, HCT, PLT in the last 8760 hours.  COAGS: No results for input(s): INR, APTT in the last 8760 hours.  BMP: Recent Labs    09/24/20 1135  BUN 19  CREATININE 0.96    LIVER FUNCTION TESTS: No results for input(s): BILITOT, AST, ALT, ALKPHOS, PROT, ALBUMIN in the last 8760 hours.  Assessment and Plan: Patient familiar to IR service from tele consultation with Dr. 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 He is a 70 y.o. male with past medical history significant for COPD/ smoking who was referred for image guided biopsy, Osteocool and cement augmentation of potentially malignant/pathologic fracture involving the superior endplate of the I95 vertebral body.  Patient was initially evaluated by Dr. Roxan Hockey for ung resection in regards to progression of mixed subsolid and solid lesion involving the left lower lobe on lung cancer screening CT performed 08/07/2020 which subsequently underwent bronchoscopic biopsy with tissue diagnosis compatible with atypical cells suspicious for malignancy with morphology favoring adenocarcinoma.    Subsequent PET/CT performed 09/12/2020 demonstrated hypermetabolism within the T11 vertebral body, further evaluated on dedicated lumbar thoracic spine MRI performed 10/01/2020 which demonstrated mild  (approximately 25%) compression deformity with enhancement worrisome for potential malignancy.  Patient states he has a long history of intermittent back pain though this is primarily associated with the lower lumbar spine however approximately 2 weeks ago he developed the acute onset of mid/lower thoracic back pain following lifting of a heavy object at work.  Patient is employed at [employer].  Pt was deemed an appropriate candidate for osteocool ablation/kyphoplasty/biopsy of T11 compression fracture and presents today for the procedure.Risks and benefits of procedure were discussed with the patient including, but not limited to education regarding the natural healing process of compression fractures without intervention, bleeding, infection, cement migration which may cause spinal cord damage, paralysis, pulmonary embolism or even death.  This interventional procedure involves the use of X-rays and because of the nature of the planned procedure, it is possible that we will have prolonged use of X-ray fluoroscopy.  Potential radiation risks to you include (but are not limited to) the following: - A slightly elevated risk for cancer  several years later in life. This risk is typically less than 0.5% percent. This risk is low in comparison to the normal incidence of human cancer, which is 33% for women and 50% for men according to the [employer]. - Radiation induced injury can include skin redness, resembling a rash, tissue breakdown / ulcers and hair loss (which can be temporary or permanent).   The likelihood of either of these occurring depends on the difficulty of the procedure and whether you are sensitive to radiation due to previous procedures, disease, or genetic  conditions.   IF your procedure requires a prolonged use of radiation, you will be notified and given written instructions for further action.  It is your responsibility to monitor the irradiated area for the 2 weeks  following the procedure and to notify your physician if you are concerned that you have suffered a radiation induced injury.    All of the patient's questions were answered, patient is agreeable to proceed.  Consent signed and in chart.      Electronically Signed: D. Rowe Robert, PA-C 10/15/2020, 8:48 AM   I spent a total of 25 minutes at the the patient's bedside AND on the patient's hospital floor or unit, greater than 50% of which was counseling/coordinating care for osteocool ablation/kyphoplasty/biopsy of T11 compression fracture

## 2020-10-15 NOTE — Discharge Instructions (Signed)
KYPHOPLASTY/VERTEBROPLASTY DISCHARGE INSTRUCTIONS  Medications: (check all that apply)    Resume all home medications as before procedure.                     Continue your pain medications as prescribed as needed.  Over the next 3-5 days, decrease your pain medication as tolerated.  Over the counter medications (i.e. Tylenol, ibuprofen, and aleve) may be substituted once severe/moderate pain symptoms have subsided. Use ice also as directed.   Wound Care: - Bandages may be removed the day following your procedure.  You may get your incision wet once bandages are removed.  Bandaids may be used to cover the incisions until scab formation.  Topical ointments are optional.  - If you develop a fever greater than 101 degrees, have increased skin redness at the incision sites or pus-like oozing from incisions occurring within 1 week of the procedure, contact radiology at [phone] or [phone].  - Ice pack to back for 15-20 minutes 2-3 time per day for first 2-3 days post procedure.  The ice will expedite muscle healing and help with the pain from the incisions.   Activity: - Bedrest today with limited activity for 24 hours post procedure.  - No driving for 48 hours.  - Increase your activity as tolerated after bedrest (with assistance if necessary).  - Refrain from any strenuous activity or heavy lifting (greater than 10 lbs.).   Follow up: - Contact radiology at [phone] (clinic) or [phone] (on call)  if any questions/concerns.  - A physician assistant from radiology will contact you in approximately 1 week.  - If a biopsy was performed at the time of your procedure, your referring physician should receive the results in usually 3-5 days.   Moderate Conscious Sedation, Adult, Care After This sheet gives you information about how to care for yourself after your procedure. Your health care provider may also give you more specific instructions. If you have problems or questions,  contact your health care provider. What can I expect after the procedure? After the procedure, it is common to have:  Sleepiness for several hours.  Impaired judgment for several hours.  Difficulty with balance.  Vomiting if you eat too soon. Follow these instructions at home: For the time period you were told by your health care provider:  Rest.  Do not participate in activities where you could fall or become injured.  Do not drive or use machinery.  Do not drink alcohol.  Do not take sleeping pills or medicines that cause drowsiness.  Do not make important decisions or sign legal documents.  Do not take care of children on your own.     Eating and drinking  Follow the diet recommended by your health care provider.  Drink enough fluid to keep your urine pale yellow.  If you vomit: ? Drink water, juice, or soup when you can drink without vomiting. ? Make sure you have little or no nausea before eating solid foods.   General instructions  Take over-the-counter and prescription medicines only as told by your health care provider.  Have a responsible adult stay with you for the time you are told. It is important to have someone help care for you until you are awake and alert.  Do not smoke.  Keep all follow-up visits as told by your health care provider. This is important. Contact a health care provider if:  You are still sleepy or having trouble with balance after 24 hours.  You feel light-headed.  You keep feeling nauseous or you keep vomiting.  You develop a rash.  You have a fever.  You have redness or swelling around the IV site. Get help right away if:  You have trouble breathing.  You have new-onset confusion at home. Summary  After the procedure, it is common to feel sleepy, have impaired judgment, or feel nauseous if you eat too soon.  Rest after you get home. Know the things you should not do after the procedure.  Follow the diet  recommended by your health care provider and drink enough fluid to keep your urine pale yellow.  Get help right away if you have trouble breathing or new-onset confusion at home. This information is not intended to replace advice given to you by your health care provider. Make sure you discuss any questions you have with your health care provider. Document Revised: 12/16/2019 Document Reviewed: 07/14/2019 Elsevier Patient Education  2021 [employer].

## 2020-10-17 LAB — SURGICAL PATHOLOGY

## 2020-10-22 DIAGNOSIS — R918 Other nonspecific abnormal finding of lung field: Secondary | ICD-10-CM | POA: Diagnosis not present

## 2020-10-22 DIAGNOSIS — E781 Pure hyperglyceridemia: Secondary | ICD-10-CM | POA: Diagnosis not present

## 2020-10-22 DIAGNOSIS — I700 Atherosclerosis of aorta: Secondary | ICD-10-CM | POA: Diagnosis not present

## 2020-10-22 DIAGNOSIS — Z125 Encounter for screening for malignant neoplasm of prostate: Secondary | ICD-10-CM | POA: Diagnosis not present

## 2020-10-22 DIAGNOSIS — Z1211 Encounter for screening for malignant neoplasm of colon: Secondary | ICD-10-CM | POA: Diagnosis not present

## 2020-10-22 DIAGNOSIS — C3430 Malignant neoplasm of lower lobe, unspecified bronchus or lung: Secondary | ICD-10-CM | POA: Diagnosis not present

## 2020-10-22 DIAGNOSIS — R69 Illness, unspecified: Secondary | ICD-10-CM | POA: Diagnosis not present

## 2020-10-22 DIAGNOSIS — Z0000 Encounter for general adult medical examination without abnormal findings: Secondary | ICD-10-CM | POA: Diagnosis not present

## 2020-10-22 DIAGNOSIS — I2510 Atherosclerotic heart disease of native coronary artery without angina pectoris: Secondary | ICD-10-CM | POA: Diagnosis not present

## 2020-10-25 ENCOUNTER — Other Ambulatory Visit: Payer: Self-pay | Admitting: *Deleted

## 2020-10-25 ENCOUNTER — Encounter: Payer: Self-pay | Admitting: Thoracic Surgery (Cardiothoracic Vascular Surgery)

## 2020-10-25 ENCOUNTER — Ambulatory Visit: Payer: Medicare HMO | Admitting: Thoracic Surgery (Cardiothoracic Vascular Surgery)

## 2020-10-25 ENCOUNTER — Other Ambulatory Visit: Payer: Self-pay

## 2020-10-25 ENCOUNTER — Encounter: Payer: Self-pay | Admitting: *Deleted

## 2020-10-25 VITALS — BP 146/88 | HR 82 | Resp 20 | Ht 76.0 in

## 2020-10-25 DIAGNOSIS — R918 Other nonspecific abnormal finding of lung field: Secondary | ICD-10-CM

## 2020-10-25 DIAGNOSIS — R911 Solitary pulmonary nodule: Secondary | ICD-10-CM | POA: Diagnosis not present

## 2020-10-25 NOTE — Progress Notes (Signed)
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  Isaac Carrillo is a 70 year old with a past history significant for tobacco abuse, COPD, anxiety, and kidney stones.  Isaac Carrillo had a greater than 40-pack-year history of smoking before quitting in 2017.  In December 2020 had a low-dose CT for lung cancer screening.  There was a groundglass opacity in the left lower lobe.  On follow-up in December 2021 there was an increase in size and some area of solid component.  Mean diameter was about 30 mm.  Dr. Valeta Harms did bronchoscopy and there were atypical cells probable adenocarcinoma.  On PET CT there was hypermetabolism in the T11 vertebral body but no CT correlate.  An MRI of the spine showed that the compression fracture.  Isaac Carrillo had a biopsy and kyphoplasty of that vertebral body by interventional radiology.  Biopsy showed no evidence of cancer.  Isaac Carrillo remains very anxious about the possible diagnosis.  Isaac Carrillo denies chest pain, pressure, or tightness.  Isaac Carrillo is not having any shortness of breath, coughing or wheezing.  Isaac Carrillo is physically active.  Zubrod Score: At the time of surgery this patient's most appropriate activity status/level should be described as: [x]     0    Normal activity, no symptoms []     1    Restricted in physical strenuous activity but ambulatory, able to do out light work []     2    Ambulatory and capable of self care, unable to do work activities, up and about >50 % of waking hours                              []     3    Only limited self care, in bed greater than 50% of waking hours []     4    Completely disabled, no self care, confined to bed or chair []     5    Moribund  Past Medical History:  Diagnosis Date  . Anxiety   . Centrilobular emphysema ([place])    Pt denies  . COPD (chronic obstructive pulmonary disease) ([place])    Pt denies  . Former smoker   .  History of kidney stones     x2 passed     Current Outpatient Medications  Medication Sig Dispense Refill  . atorvastatin (LIPITOR) 20 MG tablet Take 20 mg by mouth daily.    . citalopram (CELEXA) 10 MG tablet Take 10 mg by mouth daily. 10 MG + 20 MG=30 MG DAILY DOSAGE    . citalopram (CELEXA) 20 MG tablet Take 20 mg by mouth daily. 20 MG + 10 MG=30 MG DAILY DOSAGE    . docusate sodium (COLACE) 100 MG capsule Take 100 mg by mouth daily as needed for mild constipation.    Marland Kitchen ibuprofen (ADVIL,MOTRIN) 200 MG tablet Take 800 mg by mouth every 8 (eight) hours as needed for mild pain.    . sildenafil (REVATIO) 20 MG tablet Take 40-80 mg by mouth daily as needed (erectile dysfunction.).     No current facility-administered medications for this visit.    Physical Exam BP (!) 146/88 (BP Location: Left Arm, Patient Position: Sitting)   Pulse 82   Resp 20   Ht  6\' 4"  (1.93 m)   SpO2 96% Comment: RA  BMI 27.69 kg/m  70 year old man in no acute distress Well-developed and well-nourished Alert and oriented x3 with no focal deficits Lungs clear bilaterally  Diagnostic Tests: NUCLEAR MEDICINE PET SKULL BASE TO THIGH  TECHNIQUE: 11.16 mCi F-18 FDG was injected intravenously. Full-ring PET imaging was performed from the skull base to thigh after the radiotracer. CT data was obtained and used for attenuation correction and anatomic localization.  Fasting blood glucose: 103 mg/dl  COMPARISON:  Multiple prior chest CTs. The most recent is 08/06/2020  FINDINGS: Mediastinal blood pool activity: SUV max 2.23  Liver activity: SUV max NA  NECK: Choose 1  Incidental CT findings: none  CHEST: The left lower lobe ill-defined semi solid lesion demonstrates very slight FDG uptake with SUV max of 1.82.  14 mm left upper lobe ground-glass nodule demonstrates low level FDG uptake with SUV max of 1.66.  Vague elongated ground-glass opacity in the right upper lobe on image number 35/8  demonstrates minimal FDG uptake with SUV max of 1.14.  No worrisome solid pulmonary lesions.  No enlarged or hypermetabolic mediastinal or hilar lymph nodes.  No chest wall mass, supraclavicular or axillary adenopathy.  Incidental CT findings: Stable scattered aortic and coronary artery calcifications.  ABDOMEN/PELVIS: No abnormal hypermetabolic activity within the liver, pancreas, adrenal glands, or spleen. No hypermetabolic lymph nodes in the abdomen or pelvis.  Incidental CT findings: Scattered aortic calcifications. Small left inguinal hernia containing fat. Moderate prostatomegaly.  SKELETON: Mild background FDG uptake in the osseous structures is probably normal. There is a more focal and intense area of FDG uptake in the T11 vertebral body. I do not see any obvious CT correlate such as bone lesion or compression fracture. MRI thoracic spine without and with contrast may be helpful for further evaluation of this finding.  Incidental CT findings: none  IMPRESSION: 1. Low level FDG uptake in sub solid pulmonary lesions as detailed above. No significant hypermetabolism. 2. No enlarged or hypermetabolic mediastinal or hilar lymph nodes. 3. No significant findings in the abdomen/pelvis. 4. Moderate hypermetabolism in the T11 vertebral body without CT correlate. MRI without and with contrast if possible may be helpful for further evaluation of this finding.   Electronically Signed   By: Marijo Sanes M.D.   On: 09/12/2020 13:39 FINAL MICROSCOPIC DIAGNOSIS:   A. BONE, T11 VERTEBRAL BODY, BIOPSY:  - Scant fragments of bone and marrow elements  - No evidence of metastatic carcinoma  - See comment   Pulmonary function testing FVC 5.11 (89%) FEV1 3.85 (90%) DLCO 31.99 (99%)  Impression: Isaac Carrillo is a 70 year old man with a past history significant for tobacco abuse, COPD, anxiety, and kidney stones.  Isaac Carrillo had a greater than 40-pack-year history of  smoking before quitting in 2017.  Isaac Carrillo was first noted to have a groundglass opacity in the left lower lobe on a low-dose screening CT in December 2020.  A year later it was slightly larger and there also were some areas of a solid component.  On PET there was some hypermetabolic activity.  There also was activity in the T11 vertebral body.  There was concern for T11 vertebral body metastasis.  Needle biopsy of that was negative.  Isaac Carrillo did have fixation of that by IR.  We discussed Isaac Carrillo his case in our multidisciplinary thoracic oncology conference this morning.  The consensus of the group was to give him the benefit of the doubt regarding the vertebral biopsy  and assume that is correct.  The best treatment for the lung cancer then would be surgical resection.  Isaac Carrillo does understand that radiation as an alternative.  I described the proposed operation to Isaac Carrillo.  Isaac Carrillo was accompanied by his son.  They understand the general nature of the surgery including the need for general anesthesia, the incisions to be used, the use of drains to postoperatively, the expected hospital stay, and the overall recovery.  I informed them of the indications, risks, benefits, and alternatives.  They understand the risks include, but not limited to death, MI, DVT, PE, bleeding, possible need for transfusion, infection, prolonged air leak, cardiac arrhythmias, as well as the possibility of other unstable complications.  Isaac Carrillo understands accepts the risks and wishes to proceed as soon as possible.  Plan: Robotic left lower lobectomy on Friday for 11/02/2020  Melrose Nakayama, MD [facility] [phone]

## 2020-10-25 NOTE — H&P (View-Only) (Signed)
[address]       [place] [address]             [phone]     HPI: Mr. 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  Raheem Kolbe is a 70 year old with a past history significant for tobacco abuse, COPD, anxiety, and kidney stones.  He had a greater than 40-pack-year history of smoking before quitting in 2017.  In December 2020 had a low-dose CT for lung cancer screening.  There was a groundglass opacity in the left lower lobe.  On follow-up in December 2021 there was an increase in size and some area of solid component.  Mean diameter was about 30 mm.  Dr. Valeta Harms did bronchoscopy and there were atypical cells probable adenocarcinoma.  On PET CT there was hypermetabolism in the T11 vertebral body but no CT correlate.  An MRI of the spine showed that the compression fracture.  He had a biopsy and kyphoplasty of that vertebral body by interventional radiology.  Biopsy showed no evidence of cancer.  He remains very anxious about the possible diagnosis.  He denies chest pain, pressure, or tightness.  He is not having any shortness of breath, coughing or wheezing.  He is physically active.  Zubrod Score: At the time of surgery this patient's most appropriate activity status/level should be described as: [x]     0    Normal activity, no symptoms []     1    Restricted in physical strenuous activity but ambulatory, able to do out light work []     2    Ambulatory and capable of self care, unable to do work activities, up and about >50 % of waking hours                              []     3    Only limited self care, in bed greater than 50% of waking hours []     4    Completely disabled, no self care, confined to bed or chair []     5    Moribund  Past Medical History:  Diagnosis Date  . Anxiety   . Centrilobular emphysema ([place])    Pt denies  . COPD (chronic obstructive pulmonary disease) ([place])    Pt denies  . Former smoker   .  History of kidney stones     x2 passed     Current Outpatient Medications  Medication Sig Dispense Refill  . atorvastatin (LIPITOR) 20 MG tablet Take 20 mg by mouth daily.    . citalopram (CELEXA) 10 MG tablet Take 10 mg by mouth daily. 10 MG + 20 MG=30 MG DAILY DOSAGE    . citalopram (CELEXA) 20 MG tablet Take 20 mg by mouth daily. 20 MG + 10 MG=30 MG DAILY DOSAGE    . docusate sodium (COLACE) 100 MG capsule Take 100 mg by mouth daily as needed for mild constipation.    Marland Kitchen ibuprofen (ADVIL,MOTRIN) 200 MG tablet Take 800 mg by mouth every 8 (eight) hours as needed for mild pain.    . sildenafil (REVATIO) 20 MG tablet Take 40-80 mg by mouth daily as needed (erectile dysfunction.).     No current facility-administered medications for this visit.    Physical Exam BP (!) 146/88 (BP Location: Left Arm, Patient Position: Sitting)   Pulse 82   Resp 20   Ht  6\' 4"  (1.93 m)   SpO2 96% Comment: RA  BMI 27.49 kg/m  70 year old man in no acute distress Well-developed and well-nourished Alert and oriented x3 with no focal deficits Lungs clear bilaterally  Diagnostic Tests: NUCLEAR MEDICINE PET SKULL BASE TO THIGH  TECHNIQUE: 11.16 mCi F-18 FDG was injected intravenously. Full-ring PET imaging was performed from the skull base to thigh after the radiotracer. CT data was obtained and used for attenuation correction and anatomic localization.  Fasting blood glucose: 103 mg/dl  COMPARISON:  Multiple prior chest CTs. The most recent is 08/06/2020  FINDINGS: Mediastinal blood pool activity: SUV max 2.23  Liver activity: SUV max NA  NECK: Choose 1  Incidental CT findings: none  CHEST: The left lower lobe ill-defined semi solid lesion demonstrates very slight FDG uptake with SUV max of 1.82.  14 mm left upper lobe ground-glass nodule demonstrates low level FDG uptake with SUV max of 1.66.  Vague elongated ground-glass opacity in the right upper lobe on image number 35/8  demonstrates minimal FDG uptake with SUV max of 1.14.  No worrisome solid pulmonary lesions.  No enlarged or hypermetabolic mediastinal or hilar lymph nodes.  No chest wall mass, supraclavicular or axillary adenopathy.  Incidental CT findings: Stable scattered aortic and coronary artery calcifications.  ABDOMEN/PELVIS: No abnormal hypermetabolic activity within the liver, pancreas, adrenal glands, or spleen. No hypermetabolic lymph nodes in the abdomen or pelvis.  Incidental CT findings: Scattered aortic calcifications. Small left inguinal hernia containing fat. Moderate prostatomegaly.  SKELETON: Mild background FDG uptake in the osseous structures is probably normal. There is a more focal and intense area of FDG uptake in the T11 vertebral body. I do not see any obvious CT correlate such as bone lesion or compression fracture. MRI thoracic spine without and with contrast may be helpful for further evaluation of this finding.  Incidental CT findings: none  IMPRESSION: 1. Low level FDG uptake in sub solid pulmonary lesions as detailed above. No significant hypermetabolism. 2. No enlarged or hypermetabolic mediastinal or hilar lymph nodes. 3. No significant findings in the abdomen/pelvis. 4. Moderate hypermetabolism in the T11 vertebral body without CT correlate. MRI without and with contrast if possible may be helpful for further evaluation of this finding.   Electronically Signed   By: Marijo Sanes M.D.   On: 09/12/2020 13:39 FINAL MICROSCOPIC DIAGNOSIS:   A. BONE, T11 VERTEBRAL BODY, BIOPSY:  - Scant fragments of bone and marrow elements  - No evidence of metastatic carcinoma  - See comment   Pulmonary function testing FVC 5.11 (89%) FEV1 3.85 (90%) DLCO 31.99 (99%)  Impression: Isaac Carrillo is a 70 year old man with a past history significant for tobacco abuse, COPD, anxiety, and kidney stones.  He had a greater than 40-pack-year history of  smoking before quitting in 2017.  He was first noted to have a groundglass opacity in the left lower lobe on a low-dose screening CT in December 2020.  A year later it was slightly larger and there also were some areas of a solid component.  On PET there was some hypermetabolic activity.  There also was activity in the T11 vertebral body.  There was concern for T11 vertebral body metastasis.  Needle biopsy of that was negative.  He did have fixation of that by IR.  We discussed Isaac Carrillo his case in our multidisciplinary thoracic oncology conference this morning.  The consensus of the group was to give him the benefit of the doubt regarding the vertebral biopsy  and assume that is correct.  The best treatment for the lung cancer then would be surgical resection.  He does understand that radiation as an alternative.  I described the proposed operation to Mr. Judson.  He was accompanied by his son.  They understand the general nature of the surgery including the need for general anesthesia, the incisions to be used, the use of drains to postoperatively, the expected hospital stay, and the overall recovery.  I informed them of the indications, risks, benefits, and alternatives.  They understand the risks include, but not limited to death, MI, DVT, PE, bleeding, possible need for transfusion, infection, prolonged air leak, cardiac arrhythmias, as well as the possibility of other unstable complications.  He understands accepts the risks and wishes to proceed as soon as possible.  Plan: Robotic left lower lobectomy on Friday for 11/02/2020  Melrose Nakayama, MD [facility] [phone]

## 2020-10-25 NOTE — Progress Notes (Signed)
The proposed treatment discussed in cancer conference is for discussion purpose only and is not a binding recommendation. The patient was not physically examined nor present for their treatment options. Therefore, final treatment plans cannot be decided.  ?

## 2020-10-31 ENCOUNTER — Encounter (HOSPITAL_COMMUNITY): Payer: Self-pay

## 2020-10-31 ENCOUNTER — Encounter (HOSPITAL_COMMUNITY)
Admission: RE | Admit: 2020-10-31 | Discharge: 2020-10-31 | Disposition: A | Discharge status: Home / Self Care | Payer: Medicare HMO | Source: Ambulatory Visit | Attending: Thoracic Surgery (Cardiothoracic Vascular Surgery) | Admitting: Thoracic Surgery (Cardiothoracic Vascular Surgery)

## 2020-10-31 ENCOUNTER — Other Ambulatory Visit: Payer: Self-pay

## 2020-10-31 ENCOUNTER — Other Ambulatory Visit (HOSPITAL_COMMUNITY)
Admission: RE | Admit: 2020-10-31 | Discharge: 2020-10-31 | Disposition: A | Discharge status: Home / Self Care | Payer: Medicare HMO | Source: Ambulatory Visit

## 2020-10-31 ENCOUNTER — Ambulatory Visit (HOSPITAL_COMMUNITY)
Admission: RE | Admit: 2020-10-31 | Discharge: 2020-10-31 | Disposition: A | Discharge status: Home / Self Care | Payer: Medicare HMO | Source: Ambulatory Visit | Attending: Thoracic Surgery (Cardiothoracic Vascular Surgery) | Admitting: Thoracic Surgery (Cardiothoracic Vascular Surgery)

## 2020-10-31 DIAGNOSIS — R918 Other nonspecific abnormal finding of lung field: Secondary | ICD-10-CM | POA: Insufficient documentation

## 2020-10-31 DIAGNOSIS — Z01818 Encounter for other preprocedural examination: Secondary | ICD-10-CM | POA: Insufficient documentation

## 2020-10-31 DIAGNOSIS — Z01812 Encounter for preprocedural laboratory examination: Secondary | ICD-10-CM | POA: Insufficient documentation

## 2020-10-31 DIAGNOSIS — Z20822 Contact with and (suspected) exposure to covid-19: Secondary | ICD-10-CM | POA: Insufficient documentation

## 2020-10-31 LAB — URINALYSIS, ROUTINE W REFLEX MICROSCOPIC
Bilirubin Urine: NEGATIVE
Glucose, UA: NEGATIVE mg/dL
Hgb urine dipstick: NEGATIVE
Ketones, ur: NEGATIVE mg/dL
Leukocytes,Ua: NEGATIVE
Nitrite: NEGATIVE
Protein, ur: NEGATIVE mg/dL
Specific Gravity, Urine: 1.021 (ref 1.005–1.030)
pH: 5 (ref 5.0–8.0)

## 2020-10-31 LAB — CBC
HCT: 45.9 % (ref 39.0–52.0)
Hemoglobin: 15.5 g/dL (ref 13.0–17.0)
MCH: 31.9 pg (ref 26.0–34.0)
MCHC: 33.8 g/dL (ref 30.0–36.0)
MCV: 94.4 fL (ref 80.0–100.0)
Platelets: 144 10*3/uL — ABNORMAL LOW (ref 150–400)
RBC: 4.86 MIL/uL (ref 4.22–5.81)
RDW: 13 % (ref 11.5–15.5)
WBC: 8.8 10*3/uL (ref 4.0–10.5)
nRBC: 0 % (ref 0.0–0.2)

## 2020-10-31 LAB — SARS CORONAVIRUS 2 (TAT 6-24 HRS): SARS Coronavirus 2: NEGATIVE

## 2020-10-31 LAB — BLOOD GAS, ARTERIAL
Acid-base deficit: 2.5 mmol/L — ABNORMAL HIGH (ref 0.0–2.0)
Bicarbonate: 21.1 mmol/L (ref 20.0–28.0)
Drawn by: 58793
FIO2: 21
O2 Saturation: 97 %
Patient temperature: 37
pCO2 arterial: 32.1 mmHg (ref 32.0–48.0)
pH, Arterial: 7.432 (ref 7.350–7.450)
pO2, Arterial: 89.8 mmHg (ref 83.0–108.0)

## 2020-10-31 LAB — PROTIME-INR
INR: 1 (ref 0.8–1.2)
Prothrombin Time: 12.5 seconds (ref 11.4–15.2)

## 2020-10-31 LAB — COMPREHENSIVE METABOLIC PANEL
ALT: 36 U/L (ref 0–44)
AST: 25 U/L (ref 15–41)
Albumin: 3.7 g/dL (ref 3.5–5.0)
Alkaline Phosphatase: 125 U/L (ref 38–126)
Anion gap: 9 (ref 5–15)
BUN: 18 mg/dL (ref 8–23)
CO2: 19 mmol/L — ABNORMAL LOW (ref 22–32)
Calcium: 9.4 mg/dL (ref 8.9–10.3)
Chloride: 110 mmol/L (ref 98–111)
Creatinine, Ser: 0.94 mg/dL (ref 0.61–1.24)
GFR, Estimated: >60 mL/min (ref >60)
Glucose, Bld: 100 mg/dL — ABNORMAL HIGH (ref 70–99)
Potassium: 4.1 mmol/L (ref 3.5–5.1)
Sodium: 138 mmol/L (ref 135–145)
Total Bilirubin: 0.3 mg/dL (ref 0.3–1.2)
Total Protein: 7.2 g/dL (ref 6.5–8.1)

## 2020-10-31 LAB — TYPE AND SCREEN
ABO/RH(D): O POS
Antibody Screen: NEGATIVE

## 2020-10-31 LAB — SURGICAL PCR SCREEN
MRSA, PCR: NEGATIVE
Staphylococcus aureus: POSITIVE — AB

## 2020-10-31 LAB — APTT: aPTT: 33 seconds (ref 24–36)

## 2020-10-31 NOTE — Progress Notes (Signed)
PCP Jarrett Soho, MD Cardiologist - denies  Chest x-ray - 10/31/20 EKG - n/a Stress Test -  ECHO -  Cardiac Cath -    COVID TEST- 10/31/20   Anesthesia review: n/a  Patient denies shortness of breath, fever, cough and chest pain at PAT appointment   All instructions explained to the patient, with a verbal understanding of the material. Patient agrees to go over the instructions while at home for a better understanding. Patient also instructed to self quarantine after being tested for COVID-19. The opportunity to ask questions was provided.

## 2020-10-31 NOTE — Progress Notes (Signed)
Your procedure is scheduled on Friday November 02, 2020.  Report to [facility] Main Entrance "A" at 05:30 A.M., and check in at the Admitting office.  Call this number if you have problems the morning of surgery: [phone]  Call [phone] if you have any questions prior to your surgery date Monday-Friday 8am-4pm   Remember: Do not eat or drink after midnight the night before your surgery   Take these medicines the morning of surgery with A SIP OF WATER: atorvastatin (LIPITOR)  citalopram (CELEXA)   As of today, STOP taking any Aspirin (unless otherwise instructed by your surgeon), Aleve, Naproxen, Ibuprofen, Motrin, Advil, Goody's, BC's, all herbal medications, fish oil, and all vitamins.    The Morning of Surgery  Do not wear jewelry  Do not wear lotions, powders, colognes, or deodorant Men may shave face and neck.  Do not bring valuables to the hospital.  [facility] is not responsible for any belongings or valuables.  If you are a smoker, DO NOT Smoke 24 hours prior to surgery  If you wear a CPAP at night please bring your mask the morning of surgery   Remember that you must have someone to transport you home after your surgery, and remain with you for 24 hours if you are discharged the same day.   Please bring cases for contacts, glasses, hearing aids, dentures or bridgework because it cannot be worn into surgery.    Leave your suitcase in the car.  After surgery it may be brought to your room.  For patients admitted to the hospital, discharge time will be determined by your treatment team.  Patients discharged the day of surgery will not be allowed to drive home.    Special instructions:   [facility]- Preparing For Surgery  Before surgery, you can play an important role. Because skin is not sterile, your skin needs to be as free of germs as possible. You can reduce the number of germs on your skin by washing with CHG (chlorahexidine gluconate) Soap before  surgery.  CHG is an antiseptic cleaner which kills germs and bonds with the skin to continue killing germs even after washing.    Oral Hygiene is also important to reduce your risk of infection.  Remember - BRUSH YOUR TEETH THE MORNING OF SURGERY WITH YOUR REGULAR TOOTHPASTE  Please do not use if you have an allergy to CHG or antibacterial soaps. If your skin becomes reddened/irritated stop using the CHG.  Do not shave (including legs and underarms) for at least 48 hours prior to first CHG shower. It is OK to shave your face.  Please follow these instructions carefully.   1. Shower the NIGHT BEFORE SURGERY and the MORNING OF SURGERY with CHG Soap.   2. If you chose to wash your hair and body, wash as usual with your normal shampoo and body-wash/soap.  3. Rinse your hair and body thoroughly to remove the shampoo and soap.  4. Apply CHG directly to the skin (ONLY FROM THE NECK DOWN) and wash gently with a scrungie or a clean washcloth.   5. Do not use on open wounds or open sores. Avoid contact with your eyes, ears, mouth and genitals (private parts). Wash Face and genitals (private parts)  with your normal soap.   6. Wash thoroughly, paying special attention to the area where your surgery will be performed.  7. Thoroughly rinse your body with warm water from the neck down.  8. DO NOT shower/wash with your  normal soap after using and rinsing off the CHG Soap.  9. Pat yourself dry with a CLEAN TOWEL.  10. Wear CLEAN PAJAMAS to bed the night before surgery  11. Place CLEAN SHEETS on your bed the night of your first shower and DO NOT SLEEP WITH PETS.  12. Wear comfortable clothes the morning of surgery.     Day of Surgery:  Please shower the morning of surgery with the CHG soap Do not apply any deodorants/lotions. Please wear clean clothes to the hospital/surgery center.   Remember to brush your teeth WITH YOUR REGULAR TOOTHPASTE.   Please read over the following fact sheets  that you were given.

## 2020-11-01 NOTE — Anesthesia Preprocedure Evaluation (Addendum)
Anesthesia Evaluation  Patient identified by MRN, date of birth, ID band Patient awake    Reviewed: Allergy & Precautions, NPO status , Patient's Chart, lab work & pertinent test results  Airway Mallampati: I  TM Distance: >3 FB Neck ROM: Full    Dental  (+) Partial Lower, Upper Dentures   Pulmonary former smoker,    Pulmonary exam normal breath sounds clear to auscultation       Cardiovascular negative cardio ROS Normal cardiovascular exam Rhythm:Regular Rate:Normal     Neuro/Psych PSYCHIATRIC DISORDERS Anxiety Depression negative neurological ROS     GI/Hepatic negative GI ROS, Neg liver ROS,   Endo/Other  negative endocrine ROS  Renal/GU negative Renal ROS     Musculoskeletal negative musculoskeletal ROS (+)   Abdominal   Peds  Hematology HLD   Anesthesia Other Findings LLL MASS  Reproductive/Obstetrics                           Anesthesia Physical Anesthesia Plan  ASA: II  Anesthesia Plan: General   Post-op Pain Management:    Induction: Intravenous  PONV Risk Score and Plan: 2 and Ondansetron, Dexamethasone, Midazolam and Treatment may vary due to age or medical condition  Airway Management Planned: Double Lumen EBT  Additional Equipment: Arterial line  Intra-op Plan:   Post-operative Plan: Extubation in OR  Informed Consent: I have reviewed the patients History and Physical, chart, labs and discussed the procedure including the risks, benefits and alternatives for the proposed anesthesia with the patient or authorized representative who has indicated his/her understanding and acceptance.     Dental advisory given  Plan Discussed with: CRNA  Anesthesia Plan Comments: (PIV x 2 Potential central line placement discussed)     Anesthesia Quick Evaluation

## 2020-11-02 ENCOUNTER — Encounter (HOSPITAL_COMMUNITY): Payer: Self-pay | Admitting: Thoracic Surgery (Cardiothoracic Vascular Surgery)

## 2020-11-02 ENCOUNTER — Inpatient Hospital Stay (HOSPITAL_COMMUNITY): Payer: Medicare HMO

## 2020-11-02 ENCOUNTER — Inpatient Hospital Stay (HOSPITAL_COMMUNITY): Payer: Medicare HMO | Admitting: Certified Registered"

## 2020-11-02 ENCOUNTER — Inpatient Hospital Stay (HOSPITAL_COMMUNITY)
Admission: RE | Admit: 2020-11-02 | Discharge: 2020-11-05 | DRG: 165 | Disposition: A | Discharge status: Home / Self Care | Payer: Medicare HMO | Attending: Thoracic Surgery (Cardiothoracic Vascular Surgery) | Admitting: Thoracic Surgery (Cardiothoracic Vascular Surgery)

## 2020-11-02 ENCOUNTER — Other Ambulatory Visit: Payer: Self-pay

## 2020-11-02 ENCOUNTER — Encounter (HOSPITAL_COMMUNITY)
Admission: RE | Disposition: A | Discharge status: Home / Self Care | Payer: Self-pay | Source: Home / Self Care | Attending: Thoracic Surgery (Cardiothoracic Vascular Surgery)

## 2020-11-02 DIAGNOSIS — Z87891 Personal history of nicotine dependence: Secondary | ICD-10-CM

## 2020-11-02 DIAGNOSIS — Z9889 Other specified postprocedural states: Secondary | ICD-10-CM | POA: Diagnosis not present

## 2020-11-02 DIAGNOSIS — E785 Hyperlipidemia, unspecified: Secondary | ICD-10-CM | POA: Diagnosis not present

## 2020-11-02 DIAGNOSIS — J449 Chronic obstructive pulmonary disease, unspecified: Secondary | ICD-10-CM | POA: Diagnosis present

## 2020-11-02 DIAGNOSIS — C3492 Malignant neoplasm of unspecified part of left bronchus or lung: Secondary | ICD-10-CM

## 2020-11-02 DIAGNOSIS — Z79899 Other long term (current) drug therapy: Secondary | ICD-10-CM

## 2020-11-02 DIAGNOSIS — I2510 Atherosclerotic heart disease of native coronary artery without angina pectoris: Secondary | ICD-10-CM | POA: Diagnosis not present

## 2020-11-02 DIAGNOSIS — Z20822 Contact with and (suspected) exposure to covid-19: Secondary | ICD-10-CM | POA: Diagnosis present

## 2020-11-02 DIAGNOSIS — J432 Centrilobular emphysema: Secondary | ICD-10-CM | POA: Diagnosis present

## 2020-11-02 DIAGNOSIS — J9811 Atelectasis: Secondary | ICD-10-CM | POA: Diagnosis not present

## 2020-11-02 DIAGNOSIS — J90 Pleural effusion, not elsewhere classified: Secondary | ICD-10-CM | POA: Diagnosis not present

## 2020-11-02 DIAGNOSIS — R69 Illness, unspecified: Secondary | ICD-10-CM | POA: Diagnosis not present

## 2020-11-02 DIAGNOSIS — I10 Essential (primary) hypertension: Secondary | ICD-10-CM | POA: Diagnosis not present

## 2020-11-02 DIAGNOSIS — C3432 Malignant neoplasm of lower lobe, left bronchus or lung: Secondary | ICD-10-CM | POA: Diagnosis not present

## 2020-11-02 DIAGNOSIS — F419 Anxiety disorder, unspecified: Secondary | ICD-10-CM | POA: Diagnosis present

## 2020-11-02 DIAGNOSIS — Z4682 Encounter for fitting and adjustment of non-vascular catheter: Secondary | ICD-10-CM | POA: Diagnosis not present

## 2020-11-02 DIAGNOSIS — Z902 Acquired absence of lung [part of]: Secondary | ICD-10-CM

## 2020-11-02 DIAGNOSIS — R918 Other nonspecific abnormal finding of lung field: Secondary | ICD-10-CM

## 2020-11-02 HISTORY — PX: LYMPH NODE DISSECTION: SHX5087

## 2020-11-02 HISTORY — PX: INTERCOSTAL NERVE BLOCK: SHX5021

## 2020-11-02 LAB — ABO/RH: ABO/RH(D): O POS

## 2020-11-02 SURGERY — LOBECTOMY, LUNG, ROBOT-ASSISTED, USING VATS
Anesthesia: General | Site: Chest | Laterality: Left

## 2020-11-02 MED ORDER — DEXTROSE-NACL 5-0.9 % IV SOLN: INTRAVENOUS | Status: DC

## 2020-11-02 MED ORDER — FENTANYL CITRATE (PF) 250 MCG/5ML IJ SOLN
INTRAMUSCULAR | Status: AC
  Filled 2020-11-02: qty 5

## 2020-11-02 MED ORDER — ONDANSETRON HCL 4 MG/2ML IJ SOLN: 4.0000 mg | Freq: Once | INTRAMUSCULAR | Status: DC | PRN

## 2020-11-02 MED ORDER — PHENYLEPHRINE HCL-NACL 10-0.9 MG/250ML-% IV SOLN
INTRAVENOUS | Status: DC | PRN
  Administered 2020-11-02: 30 ug/min via INTRAVENOUS

## 2020-11-02 MED ORDER — ALBUTEROL SULFATE (2.5 MG/3ML) 0.083% IN NEBU: 2.5000 mg | INHALATION_SOLUTION | RESPIRATORY_TRACT | Status: DC

## 2020-11-02 MED ORDER — ONDANSETRON HCL 4 MG/2ML IJ SOLN: 4.0000 mg | Freq: Four times a day (QID) | INTRAMUSCULAR | Status: DC | PRN

## 2020-11-02 MED ORDER — TRAMADOL HCL 50 MG PO TABS
50.0000 mg | ORAL_TABLET | Freq: Four times a day (QID) | ORAL | Status: DC | PRN
  Administered 2020-11-03 (×2): 100 mg via ORAL
  Filled 2020-11-02 (×2): qty 2

## 2020-11-02 MED ORDER — FENTANYL CITRATE (PF) 250 MCG/5ML IJ SOLN
INTRAMUSCULAR | Status: DC | PRN
  Administered 2020-11-02 (×2): 50 ug via INTRAVENOUS
  Administered 2020-11-02: 150 ug via INTRAVENOUS

## 2020-11-02 MED ORDER — PROPOFOL 10 MG/ML IV BOLUS
INTRAVENOUS | Status: AC
  Filled 2020-11-02: qty 20

## 2020-11-02 MED ORDER — DEXMEDETOMIDINE (PRECEDEX) IN NS 20 MCG/5ML (4 MCG/ML) IV SYRINGE
PREFILLED_SYRINGE | INTRAVENOUS | Status: DC | PRN
  Administered 2020-11-02 (×2): 8 ug via INTRAVENOUS

## 2020-11-02 MED ORDER — KETOROLAC TROMETHAMINE 15 MG/ML IJ SOLN
INTRAMUSCULAR | Status: AC
  Administered 2020-11-02: 15 mg via INTRAVENOUS
  Filled 2020-11-02: qty 1

## 2020-11-02 MED ORDER — CEFAZOLIN SODIUM-DEXTROSE 2-4 GM/100ML-% IV SOLN
2.0000 g | INTRAVENOUS | Status: AC
  Administered 2020-11-02: 2 g via INTRAVENOUS
  Filled 2020-11-02: qty 100

## 2020-11-02 MED ORDER — PANTOPRAZOLE SODIUM 40 MG PO TBEC
40.0000 mg | DELAYED_RELEASE_TABLET | Freq: Every day | ORAL | Status: DC
  Administered 2020-11-02 – 2020-11-05 (×4): 40 mg via ORAL
  Filled 2020-11-02 (×4): qty 1

## 2020-11-02 MED ORDER — MIDAZOLAM HCL 2 MG/2ML IJ SOLN
INTRAMUSCULAR | Status: AC
  Filled 2020-11-02: qty 2

## 2020-11-02 MED ORDER — HYDROMORPHONE HCL 1 MG/ML IJ SOLN
INTRAMUSCULAR | Status: AC
  Administered 2020-11-02: 0.5 mg via INTRAVENOUS
  Filled 2020-11-02: qty 1

## 2020-11-02 MED ORDER — LACTATED RINGERS IV SOLN: INTRAVENOUS | Status: DC | PRN

## 2020-11-02 MED ORDER — ATORVASTATIN CALCIUM 10 MG PO TABS
20.0000 mg | ORAL_TABLET | Freq: Every day | ORAL | Status: DC
  Administered 2020-11-03 – 2020-11-05 (×3): 20 mg via ORAL
  Filled 2020-11-02 (×3): qty 2

## 2020-11-02 MED ORDER — PHENYLEPHRINE 40 MCG/ML (10ML) SYRINGE FOR IV PUSH (FOR BLOOD PRESSURE SUPPORT)
PREFILLED_SYRINGE | INTRAVENOUS | Status: DC | PRN
  Administered 2020-11-02 (×2): 80 ug via INTRAVENOUS

## 2020-11-02 MED ORDER — GLYCOPYRROLATE 0.2 MG/ML IJ SOLN
INTRAMUSCULAR | Status: DC | PRN
  Administered 2020-11-02: .1 mg via INTRAVENOUS

## 2020-11-02 MED ORDER — SENNOSIDES-DOCUSATE SODIUM 8.6-50 MG PO TABS
1.0000 | ORAL_TABLET | Freq: Every day | ORAL | Status: DC
  Administered 2020-11-02 – 2020-11-04 (×3): 1 via ORAL
  Filled 2020-11-02 (×4): qty 1

## 2020-11-02 MED ORDER — ACETAMINOPHEN 500 MG PO TABS
1000.0000 mg | ORAL_TABLET | Freq: Four times a day (QID) | ORAL | Status: DC
  Administered 2020-11-02 – 2020-11-05 (×9): 1000 mg via ORAL
  Filled 2020-11-02 (×11): qty 2

## 2020-11-02 MED ORDER — BUPIVACAINE LIPOSOME 1.3 % IJ SUSP
20.0000 mL | Freq: Once | INTRAMUSCULAR | Status: DC
  Filled 2020-11-02: qty 20

## 2020-11-02 MED ORDER — ACETAMINOPHEN 160 MG/5ML PO SOLN: 1000.0000 mg | Freq: Four times a day (QID) | ORAL | Status: DC

## 2020-11-02 MED ORDER — SUGAMMADEX SODIUM 200 MG/2ML IV SOLN
INTRAVENOUS | Status: DC | PRN
  Administered 2020-11-02: 200 mg via INTRAVENOUS

## 2020-11-02 MED ORDER — CEFAZOLIN SODIUM-DEXTROSE 2-4 GM/100ML-% IV SOLN
2.0000 g | Freq: Three times a day (TID) | INTRAVENOUS | Status: AC
  Administered 2020-11-02 (×2): 2 g via INTRAVENOUS
  Filled 2020-11-02 (×2): qty 100

## 2020-11-02 MED ORDER — HYDROMORPHONE HCL 1 MG/ML IJ SOLN
0.5000 mg | INTRAMUSCULAR | Status: DC | PRN
  Administered 2020-11-02: 0.5 mg via INTRAVENOUS

## 2020-11-02 MED ORDER — BUPIVACAINE HCL (PF) 0.5 % IJ SOLN
INTRAMUSCULAR | Status: AC
  Filled 2020-11-02: qty 30

## 2020-11-02 MED ORDER — KETOROLAC TROMETHAMINE 15 MG/ML IJ SOLN
15.0000 mg | Freq: Four times a day (QID) | INTRAMUSCULAR | Status: DC
  Administered 2020-11-02 – 2020-11-03 (×3): 15 mg via INTRAVENOUS
  Filled 2020-11-02 (×3): qty 1

## 2020-11-02 MED ORDER — MIDAZOLAM HCL 5 MG/5ML IJ SOLN
INTRAMUSCULAR | Status: DC | PRN
  Administered 2020-11-02: 2 mg via INTRAVENOUS

## 2020-11-02 MED ORDER — ACETAMINOPHEN 500 MG PO TABS
1000.0000 mg | ORAL_TABLET | Freq: Once | ORAL | Status: AC
  Administered 2020-11-02: 1000 mg via ORAL
  Filled 2020-11-02: qty 2

## 2020-11-02 MED ORDER — CITALOPRAM HYDROBROMIDE 20 MG PO TABS
20.0000 mg | ORAL_TABLET | Freq: Every day | ORAL | Status: DC
Start: 2020-11-03 — End: 2020-11-05
  Administered 2020-11-03 – 2020-11-05 (×3): 20 mg via ORAL
  Filled 2020-11-02 (×3): qty 1

## 2020-11-02 MED ORDER — DEXAMETHASONE SODIUM PHOSPHATE 10 MG/ML IJ SOLN
INTRAMUSCULAR | Status: DC | PRN
  Administered 2020-11-02: 10 mg via INTRAVENOUS

## 2020-11-02 MED ORDER — BISACODYL 5 MG PO TBEC
10.0000 mg | DELAYED_RELEASE_TABLET | Freq: Every day | ORAL | Status: DC
  Administered 2020-11-02 – 2020-11-04 (×3): 10 mg via ORAL
  Filled 2020-11-02 (×3): qty 2

## 2020-11-02 MED ORDER — PROPOFOL 10 MG/ML IV BOLUS
INTRAVENOUS | Status: DC | PRN
  Administered 2020-11-02: 200 mg via INTRAVENOUS

## 2020-11-02 MED ORDER — ONDANSETRON HCL 4 MG/2ML IJ SOLN
INTRAMUSCULAR | Status: DC | PRN
  Administered 2020-11-02: 4 mg via INTRAVENOUS

## 2020-11-02 MED ORDER — LIDOCAINE 2% (20 MG/ML) 5 ML SYRINGE
INTRAMUSCULAR | Status: DC | PRN
  Administered 2020-11-02: 60 mg via INTRAVENOUS

## 2020-11-02 MED ORDER — OXYCODONE HCL 5 MG PO TABS
5.0000 mg | ORAL_TABLET | ORAL | Status: DC | PRN
  Administered 2020-11-02 – 2020-11-05 (×12): 10 mg via ORAL
  Filled 2020-11-02 (×12): qty 2

## 2020-11-02 MED ORDER — ALBUTEROL SULFATE (2.5 MG/3ML) 0.083% IN NEBU
2.5000 mg | INHALATION_SOLUTION | RESPIRATORY_TRACT | Status: DC
  Administered 2020-11-02: 2.5 mg via RESPIRATORY_TRACT
  Filled 2020-11-02: qty 3

## 2020-11-02 MED ORDER — FENTANYL CITRATE (PF) 100 MCG/2ML IJ SOLN
INTRAMUSCULAR | Status: AC
  Administered 2020-11-02: 50 ug via INTRAVENOUS
  Filled 2020-11-02: qty 4

## 2020-11-02 MED ORDER — SODIUM CHLORIDE 0.9% IV SOLUTION
INTRAVENOUS | Status: AC | PRN
  Administered 2020-11-02: 1000 mL via INTRAMUSCULAR

## 2020-11-02 MED ORDER — 0.9 % SODIUM CHLORIDE (POUR BTL) OPTIME
TOPICAL | Status: DC | PRN
  Administered 2020-11-02: 2000 mL

## 2020-11-02 MED ORDER — AMISULPRIDE (ANTIEMETIC) 5 MG/2ML IV SOLN: 10.0000 mg | Freq: Once | INTRAVENOUS | Status: DC | PRN

## 2020-11-02 MED ORDER — FENTANYL CITRATE (PF) 100 MCG/2ML IJ SOLN
25.0000 ug | INTRAMUSCULAR | Status: DC | PRN
  Administered 2020-11-02 (×2): 50 ug via INTRAVENOUS

## 2020-11-02 MED ORDER — SODIUM CHLORIDE FLUSH 0.9 % IV SOLN
INTRAVENOUS | Status: DC | PRN
  Administered 2020-11-02: 100 mL

## 2020-11-02 MED ORDER — KETOROLAC TROMETHAMINE 15 MG/ML IJ SOLN: 15.0000 mg | Freq: Once | INTRAMUSCULAR | Status: AC | PRN

## 2020-11-02 MED ORDER — ROCURONIUM BROMIDE 10 MG/ML (PF) SYRINGE
PREFILLED_SYRINGE | INTRAVENOUS | Status: DC | PRN
  Administered 2020-11-02: 50 mg via INTRAVENOUS
  Administered 2020-11-02: 100 mg via INTRAVENOUS
  Administered 2020-11-02: 30 mg via INTRAVENOUS

## 2020-11-02 MED ORDER — CHLORHEXIDINE GLUCONATE 0.12 % MT SOLN
OROMUCOSAL | Status: AC
  Administered 2020-11-02: 15 mL
  Filled 2020-11-02: qty 15

## 2020-11-02 SURGICAL SUPPLY — 115 items
ADH SKN CLS APL DERMABOND .7 (GAUZE/BANDAGES/DRESSINGS) ×1
APPLIER CLIP 5 13 M/L LIGAMAX5 (MISCELLANEOUS) ×2
APR CLP MED LRG 5 ANG JAW (MISCELLANEOUS) ×1
BAG SPEC RTRVL C1550 15 (MISCELLANEOUS) ×1
BLADE CLIPPER SURG (BLADE) IMPLANT
CANISTER SUCT 3000ML PPV (MISCELLANEOUS) ×4 IMPLANT
CANNULA REDUC XI 12-8 STAPL (CANNULA) ×4
CANNULA REDUCER 12-8 DVNC XI (CANNULA) ×2 IMPLANT
CATH THORACIC 28FR (CATHETERS) IMPLANT
CATH THORACIC 28FR RT ANG (CATHETERS) IMPLANT
CATH THORACIC 36FR (CATHETERS) IMPLANT
CATH THORACIC 36FR RT ANG (CATHETERS) IMPLANT
CLIP APPLIE 5 13 M/L LIGAMAX5 (MISCELLANEOUS) IMPLANT
CLIP VESOCCLUDE MED 6/CT (CLIP) IMPLANT
CNTNR URN SCR LID CUP LEK RST (MISCELLANEOUS) ×5 IMPLANT
CONN ST 1/4X3/8  BEN (MISCELLANEOUS)
CONN ST 1/4X3/8 BEN (MISCELLANEOUS) IMPLANT
CONN Y 3/8X3/8X3/8  BEN (MISCELLANEOUS)
CONN Y 3/8X3/8X3/8 BEN (MISCELLANEOUS) IMPLANT
CONT SPEC 4OZ STRL OR WHT (MISCELLANEOUS) ×16
DEFOGGER SCOPE WARMER CLEARIFY (MISCELLANEOUS) ×2 IMPLANT
DERMABOND ADVANCED (GAUZE/BANDAGES/DRESSINGS) ×1
DERMABOND ADVANCED .7 DNX12 (GAUZE/BANDAGES/DRESSINGS) ×1 IMPLANT
DRAIN CHANNEL 28F RND 3/8 FF (WOUND CARE) IMPLANT
DRAIN CHANNEL 32F RND 10.7 FF (WOUND CARE) IMPLANT
DRAPE ARM DVNC X/XI (DISPOSABLE) ×4 IMPLANT
DRAPE COLUMN DVNC XI (DISPOSABLE) ×1 IMPLANT
DRAPE CV SPLIT W-CLR ANES SCRN (DRAPES) ×2 IMPLANT
DRAPE DA VINCI XI ARM (DISPOSABLE) ×8
DRAPE DA VINCI XI COLUMN (DISPOSABLE) ×2
DRAPE INCISE IOBAN 66X45 STRL (DRAPES) IMPLANT
DRAPE ORTHO SPLIT 77X108 STRL (DRAPES) ×2
DRAPE SURG ORHT 6 SPLT 77X108 (DRAPES) ×1 IMPLANT
ELECT BLADE 6.5 EXT (BLADE) ×2 IMPLANT
ELECT REM PT RETURN 9FT ADLT (ELECTROSURGICAL) ×2
ELECTRODE REM PT RTRN 9FT ADLT (ELECTROSURGICAL) ×1 IMPLANT
GAUZE KITTNER 4X5 RF (MISCELLANEOUS) ×4 IMPLANT
GAUZE SPONGE 4X4 12PLY STRL (GAUZE/BANDAGES/DRESSINGS) ×2 IMPLANT
GLOVE SURG SS PI 8.0 STRL IVOR (GLOVE) ×2 IMPLANT
GLOVE SURG SYN 7.5  E (GLOVE)
GLOVE SURG SYN 7.5 E (GLOVE) IMPLANT
GLOVE SURG SYN 7.5 PF PI (GLOVE) IMPLANT
GLOVE TRIUMPH SURG SIZE 7.5 (KITS) IMPLANT
GOWN STRL REUS W/ TWL LRG LVL3 (GOWN DISPOSABLE) ×2 IMPLANT
GOWN STRL REUS W/ TWL XL LVL3 (GOWN DISPOSABLE) ×3 IMPLANT
GOWN STRL REUS W/TWL 2XL LVL3 (GOWN DISPOSABLE) ×2 IMPLANT
GOWN STRL REUS W/TWL LRG LVL3 (GOWN DISPOSABLE) ×4
GOWN STRL REUS W/TWL XL LVL3 (GOWN DISPOSABLE) ×6
HEMOSTAT SURGICEL 2X14 (HEMOSTASIS) ×5 IMPLANT
IRRIGATION STRYKERFLOW (MISCELLANEOUS) ×1 IMPLANT
IRRIGATOR STRYKERFLOW (MISCELLANEOUS) ×2
KIT BASIN OR (CUSTOM PROCEDURE TRAY) ×2 IMPLANT
KIT SUCTION CATH 14FR (SUCTIONS) IMPLANT
KIT TURNOVER KIT B (KITS) ×2 IMPLANT
LOOP VESSEL SUPERMAXI WHITE (MISCELLANEOUS) IMPLANT
NDL HYPO 25GX1X1/2 BEV (NEEDLE) ×1 IMPLANT
NDL SPNL 22GX3.5 QUINCKE BK (NEEDLE) ×1 IMPLANT
NEEDLE HYPO 25GX1X1/2 BEV (NEEDLE) ×2 IMPLANT
NEEDLE SPNL 22GX3.5 QUINCKE BK (NEEDLE) ×2 IMPLANT
NS IRRIG 1000ML POUR BTL (IV SOLUTION) ×4 IMPLANT
PACK CHEST (CUSTOM PROCEDURE TRAY) ×2 IMPLANT
PAD ARMBOARD 7.5X6 YLW CONV (MISCELLANEOUS) ×4 IMPLANT
PORT ACCESS TROCAR AIRSEAL 12 (TROCAR) ×1 IMPLANT
PORT ACCESS TROCAR AIRSEAL 5M (TROCAR) ×1
RELOAD STAPLE 45 2.5 WHT DVNC (STAPLE) IMPLANT
RELOAD STAPLE 45 3.5 BLU DVNC (STAPLE) IMPLANT
RELOAD STAPLE 45 4.3 GRN DVNC (STAPLE) IMPLANT
RELOAD STAPLER 2.5X45 WHT DVNC (STAPLE) ×3 IMPLANT
RELOAD STAPLER 3.5X45 BLU DVNC (STAPLE) ×3 IMPLANT
RELOAD STAPLER 4.3X45 GRN DVNC (STAPLE) ×1 IMPLANT
SCISSORS LAP 5X35 DISP (ENDOMECHANICALS) IMPLANT
SEAL CANN UNIV 5-8 DVNC XI (MISCELLANEOUS) ×2 IMPLANT
SEAL XI 5MM-8MM UNIVERSAL (MISCELLANEOUS) ×4
SEALANT PROGEL (MISCELLANEOUS) IMPLANT
SEALANT SURG COSEAL 4ML (VASCULAR PRODUCTS) IMPLANT
SEALANT SURG COSEAL 8ML (VASCULAR PRODUCTS) IMPLANT
SET TRI-LUMEN FLTR TB AIRSEAL (TUBING) ×2 IMPLANT
SHEARS HARMONIC HDI 20CM (ELECTROSURGICAL) IMPLANT
SOLUTION ELECTROLUBE (MISCELLANEOUS) ×2 IMPLANT
SPONGE INTESTINAL PEANUT (DISPOSABLE) IMPLANT
SPONGE TONSIL TAPE 1 RFD (DISPOSABLE) IMPLANT
STAPLER CANNULA SEAL DVNC XI (STAPLE) ×2 IMPLANT
STAPLER CANNULA SEAL XI (STAPLE) ×4
STAPLER RELOAD 2.5X45 WHITE (STAPLE) ×6
STAPLER RELOAD 2.5X45 WHT DVNC (STAPLE) ×3
STAPLER RELOAD 3.5X45 BLU DVNC (STAPLE) ×3
STAPLER RELOAD 3.5X45 BLUE (STAPLE) ×6
STAPLER RELOAD 4.3X45 GREEN (STAPLE) ×2
STAPLER RELOAD 4.3X45 GRN DVNC (STAPLE) ×1
SUT PDS AB 3-0 SH 27 (SUTURE) IMPLANT
SUT PROLENE 4 0 RB 1 (SUTURE)
SUT PROLENE 4-0 RB1 .5 CRCL 36 (SUTURE) IMPLANT
SUT SILK  1 MH (SUTURE) ×2
SUT SILK 1 MH (SUTURE) ×1 IMPLANT
SUT SILK 1 TIES 10X30 (SUTURE) ×1 IMPLANT
SUT SILK 2 0 SH (SUTURE) ×2 IMPLANT
SUT SILK 2 0SH CR/8 30 (SUTURE) IMPLANT
SUT SILK 3 0SH CR/8 30 (SUTURE) IMPLANT
SUT VIC AB 1 CTX 36 (SUTURE) ×2
SUT VIC AB 1 CTX36XBRD ANBCTR (SUTURE) ×1 IMPLANT
SUT VIC AB 2-0 CTX 36 (SUTURE) ×3 IMPLANT
SUT VIC AB 3-0 MH 27 (SUTURE) IMPLANT
SUT VIC AB 3-0 X1 27 (SUTURE) ×4 IMPLANT
SUT VICRYL 0 TIES 12 18 (SUTURE) ×2 IMPLANT
SUT VICRYL 0 UR6 27IN ABS (SUTURE) ×4 IMPLANT
SUT VICRYL 2 TP 1 (SUTURE) IMPLANT
SYR 20CC LL (SYRINGE) ×4 IMPLANT
SYSTEM RETRIEVAL ANCHOR 15 (MISCELLANEOUS) ×1 IMPLANT
SYSTEM SAHARA CHEST DRAIN ATS (WOUND CARE) ×2 IMPLANT
TAPE CLOTH 4X10 WHT NS (GAUZE/BANDAGES/DRESSINGS) ×2 IMPLANT
TAPE CLOTH SURG 4X10 WHT LF (GAUZE/BANDAGES/DRESSINGS) ×1 IMPLANT
TIP APPLICATOR SPRAY EXTEND 16 (VASCULAR PRODUCTS) IMPLANT
TOWEL GREEN STERILE (TOWEL DISPOSABLE) ×2 IMPLANT
TRAY FOLEY MTR SLVR 16FR STAT (SET/KITS/TRAYS/PACK) ×2 IMPLANT
WATER STERILE IRR 1000ML POUR (IV SOLUTION) ×2 IMPLANT

## 2020-11-02 NOTE — Anesthesia Procedure Notes (Signed)
Procedure Name: Intubation Date/Time: 11/02/2020 7:44 AM Performed by: Griffin Dakin, CRNA Pre-anesthesia Checklist: Patient identified, Emergency Drugs available, Suction available and Patient being monitored Patient Re-evaluated:Patient Re-evaluated prior to induction Oxygen Delivery Method: Circle system utilized Preoxygenation: Pre-oxygenation with 100% oxygen Induction Type: IV induction Ventilation: Oral airway inserted - appropriate to patient size and Two handed mask ventilation required Laryngoscope Size: Mac and 4 Grade View: Grade I Tube type: Oral Endobronchial tube: Left and Double lumen EBT and 41 Fr Number of attempts: 1 Airway Equipment and Method: Stylet and Oral airway Placement Confirmation: ETT inserted through vocal cords under direct vision,  positive ETCO2 and breath sounds checked- equal and bilateral Secured at: 31 cm Tube secured with: Tape Dental Injury: Teeth and Oropharynx as per pre-operative assessment

## 2020-11-02 NOTE — Discharge Summary (Addendum)
Physician Discharge Summary  Patient ID: Isaac Carrillo MRN: [id number] DOB/AGE: 03/24/1951 70 y.o.  Admit date: 11/02/2020 Discharge date: 11/05/2020  Admission Diagnoses: Left lower lobe lung mass- suspected adenocarcinoma- clinical stage T2N0  Discharge Diagnoses:  1. 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 S/p robotic assisted LLL, lymph node dissection, and intercostal nerve block 3. History of COPD (chronic obstructive pulmonary disease) ([place]) 4. History of centri lobar emphysema 5. History of kidney stones 6. History of anxiety 7. History of tobacco abuse  Discharged Condition: stable  HPI: at time of consultation:  Isaac Carrillo is a 70 year old with a past history significant for tobacco abuse, COPD, anxiety, and kidney stones.  He had a greater than 40-pack-year history of smoking before quitting in 2017.  In December 2020 had a low-dose CT for lung cancer screening.  There was a groundglass opacity in the left lower lobe.  On follow-up in December 2021 there was an increase in size and some area of solid component.  Mean diameter was about 30 mm.  Dr. Valeta Harms did bronchoscopy and there were atypical cells probable adenocarcinoma.  On PET CT there was hypermetabolism in the T11 vertebral body but no CT correlate.  An MRI of the spine showed that the compression fracture.  He had a biopsy and kyphoplasty of that vertebral body by interventional radiology.  Biopsy showed no evidence of cancer.   He remains very anxious about the possible diagnosis.  He denies chest pain, pressure, or tightness.  He is not having any shortness of breath, coughing or wheezing.  He is physically active.  Hospital course: Patient was admitted electively and taken the operating room and underwent a robotic assisted thoracoscopic surgery with left lower lobectomy.  Additionally there was lymph node dissection.  Patient tolerated procedure well was taken to the postanesthesia care  unit in stable condition.  Postoperative hospital course: After recovery in the PACU, he was transferred to [facility].  His respiratory status remained stable.  He had minimal drainage from the chest tube overnight and no air leak on postop day 1.  The chest tube was removed.  Follow-up chest x-ray was satisfactory with no pneumothorax.  On postop day 2, he had some abdominal distention without abdominal pain.  He was treated with lactulose and Reglan.  Ambulation was encouraged. He had multiple large bowel movements on 03/06. He has been tolerating a diet. He did not have nausea, vomiting, or abdominal pain. All wounds are clean, dry, and healing without signs of infection. Final PA/LAT CXR showed increased atelectasis right base (?developing PNA, although no other clinical signs), no pneumothorax, and mild elevation of left hemidiaphragm. Patient is felt surgically stable for discharge today.  Consults: None  Significant Diagnostic Studies:  CLINICAL DATA:  70 year old male status post lobectomy.  EXAM: CHEST - 2 VIEW  COMPARISON:  11/03/2020  FINDINGS: The heart size and mediastinal contours are within normal limits, unchanged. Bibasilar subsegmental atelectasis, right greater than left. No pneumothorax or significant pleural effusions. Surgical clips are again seen projecting over the left inferior hemithorax. No acute osseous abnormality.  IMPRESSION: Bibasilar subsegmental atelectasis.  No pneumothorax.   Electronically Signed   By: Ruthann Cancer MD   On: 11/04/2020 08:08  Treatments:   Operative Report   DATE OF PROCEDURE: 11/02/2020  PREOPERATIVE DIAGNOSIS:  Left lower lobe lung mass, probable adenocarcinoma.  POSTOPERATIVE DIAGNOSIS:  Adenocarcinoma, left lower lobe, clinical stage IB (T2, N0).  PROCEDURE PERFORMED:  Xi robotic-assisted left lower  lobectomy, lymph node dissection and intercostal nerve blocks at levels 3 through 10.  SURGEON:  Revonda Standard.  Roxan Hockey, MD  ASSISTANT:  Jadene Pierini, PA  ANESTHESIA:  General.  FINDINGS:  Mass visible in the posterior aspect of left lower lobe, normal-appearing lymph nodes.  Frozen section revealed adenocarcinoma.  Bronchial margin was free of tumor.  CLINICAL NOTE:  The patient is a 70 year old man with a history of tobacco abuse.  He was found to have a left lower lobe ground-glass opacity on a CT for lung cancer screening back in 08/2019.  On followup, there was an increase in size as well as some  component that was solid with a mean diameter of approximately 3 cm.  Bronchoscopy showed atypical cells highly suspicious for adenocarcinoma.  On his metastatic workup, there was a question of a lesion at the T11 vertebral body.  Biopsies of that were  negative.  He was offered the option of left lower lobectomy.  The indications, risks, benefits, and alternatives were discussed in detail with the patient.  He understood and accepted the risks and agreed to proceed.   Discharge Exam: Blood pressure (!) 141/70, pulse 78, temperature 97.9 F (36.6 C), temperature source Oral, resp. rate 20, height 6\' 4"  (1.93 m), weight 105.7 kg, SpO2 95 %. Cardiovascular: RRR, Pulmonary: Clear to auscultation bilaterally Abdomen: Soft, non tender, ++ bowel sounds present. Extremities: SCDs in place Wounds: Clean and dry.  No erythema or signs of infection.   Disposition: Discharge disposition: 01-Home or Self Care     Stable and discharged to home.  Discharge Instructions    Discharge patient   Complete by: As directed    Discharge disposition: 01-Home or Self Care   Discharge patient date: 11/05/2020     Allergies as of 11/05/2020   No Known Allergies     Medication List    TAKE these medications   atorvastatin 20 MG tablet Commonly known as: LIPITOR Take 20 mg by mouth daily.   citalopram 10 MG tablet Commonly known as: CELEXA Take 10 mg by mouth daily.   citalopram 20 MG tablet Commonly  known as: CELEXA Take 20 mg by mouth daily.   docusate sodium 100 MG capsule Commonly known as: COLACE Take 100 mg by mouth daily as needed for mild constipation.   ibuprofen 200 MG tablet Commonly known as: ADVIL Take 800 mg by mouth every 8 (eight) hours as needed for mild pain.   oxyCODONE 5 MG immediate release tablet Commonly known as: Oxy IR/ROXICODONE Take 1 tablet (5 mg total) by mouth every 4 (four) hours as needed for severe pain.   sildenafil 20 MG tablet Commonly known as: REVATIO Take 40-80 mg by mouth daily as needed (erectile dysfunction.).       Follow-up Information    Melrose Nakayama, MD. Go on 11/20/2020.   Specialty: Cardiothoracic Surgery Why: Your follow-up appointment is on Tuesday, 11/20/2020, at 9:45 AM. Please arrive 30 minutes early for chest x-ray to be performed by [facility] imaging located on the first floor of the same building Contact information: [address] [place] [address] [phone]               Signed: John Giovanni, PA-C 11/05/2020, 12:24 PM

## 2020-11-02 NOTE — Transfer of Care (Signed)
Immediate Anesthesia Transfer of Care Note  Patient: Isaac Carrillo  Procedure(s) Performed: XI ROBOTIC ASSISTED THORASCOPY-LEFT LOWER LOBECTOMY (Left Chest) INTERCOSTAL NERVE BLOCK (Left Chest) LYMPH NODE DISSECTION (Left Chest)  Patient Location: PACU  Anesthesia Type:General  Level of Consciousness: awake, alert  and oriented  Airway & Oxygen Therapy: Patient Spontanous Breathing and Patient connected to face mask oxygen  Post-op Assessment: Report given to RN and Post -op Vital signs reviewed and stable  Post vital signs: Reviewed and stable  Last Vitals:  Vitals Value Taken Time  BP    Temp    Pulse 75 11/02/20 1118  Resp    SpO2 98 % 11/02/20 1118  Vitals shown include unvalidated device data.  Last Pain:  Vitals:   11/02/20 0610  TempSrc: Oral  PainSc:          Complications: No complications documented.

## 2020-11-02 NOTE — Brief Op Note (Addendum)
11/02/2020  11:04 AM  PATIENT:  [name]  70 y.o. male  PRE-OPERATIVE DIAGNOSIS:  LEFT LOWER LOBE MASS  POST-OPERATIVE DIAGNOSIS:  ADENOCARCINOMA LEFT LOWER LOBE, CLINICAL STAGE IB (T2N0)  PROCEDURE:  Procedure(s): XI ROBOTIC ASSISTED THORASCOPY-LEFT LOWER LOBECTOMY (Left) INTERCOSTAL NERVE BLOCK (Left) LYMPH NODE DISSECTION (Left)  SURGEON:  Surgeon(s) and Role:    * Melrose Nakayama, MD - Primary  PHYSICIAN ASSISTANT: WAYNE GOLD PA-C   ANESTHESIA:   general  EBL:  100 mL   BLOOD ADMINISTERED:none  DRAINS: (1 59 F) Blake drain(s) in the LEFT HEMITHORAX   LOCAL MEDICATIONS USED:  OTHER EXPAREL  SPECIMEN:  Source of Specimen:  LEFT LOWER LOBE, MULT LN SAMPLES  DISPOSITION OF SPECIMEN:  PATHOLOGY  COUNTS:  YES  TOURNIQUET:  * No tourniquets in log *  DICTATION: .Other Dictation: Dictation Number [id number]  PLAN OF CARE: Admit to inpatient   PATIENT DISPOSITION:  PACU - hemodynamically stable.   Delay start of Pharmacological VTE agent (>24hrs) due to surgical blood loss or risk of bleeding: yes  COMPLICATIONS: NO KNOWN

## 2020-11-02 NOTE — Anesthesia Postprocedure Evaluation (Signed)
Anesthesia Post Note  Patient: [name]  Procedure(s) Performed: XI ROBOTIC ASSISTED THORASCOPY-LEFT LOWER LOBECTOMY (Left Chest) INTERCOSTAL NERVE BLOCK (Left Chest) LYMPH NODE DISSECTION (Left Chest)     Patient location during evaluation: PACU Anesthesia Type: General Level of consciousness: awake and alert Pain management: pain level controlled Vital Signs Assessment: post-procedure vital signs reviewed and stable Respiratory status: spontaneous breathing, nonlabored ventilation, respiratory function stable and patient connected to nasal cannula oxygen Cardiovascular status: blood pressure returned to baseline and stable Postop Assessment: no apparent nausea or vomiting Anesthetic complications: no   No complications documented.  Last Vitals:  Vitals:   11/02/20 1320 11/02/20 1330  BP: 109/68 136/72  Pulse: 60 65  Resp: 12 14  Temp:    SpO2: 96% 96%    Last Pain:  Vitals:   11/02/20 1330  TempSrc:   PainSc: Asleep                 Ryan P Ellender

## 2020-11-02 NOTE — Interval H&P Note (Signed)
History and Physical Interval Note:  11/02/2020 7:06 AM  Isaac Carrillo  has presented today for surgery, with the diagnosis of LLL MASS.  The various methods of treatment have been discussed with the patient and family. After consideration of risks, benefits and other options for treatment, the patient has consented to  Procedure(s): XI ROBOTIC ASSISTED THORASCOPY-LEFT LOWER LOBECTOMY (Left) as a surgical intervention.  The patient's history has been reviewed, patient examined, no change in status, stable for surgery.  I have reviewed the patient's chart and labs.  Questions were answered to the patient's satisfaction.     Melrose Nakayama

## 2020-11-02 NOTE — Hospital Course (Addendum)
HPI: at time of consultation:  Isaac Carrillo is a 70 year old with a past history significant for tobacco abuse, COPD, anxiety, and kidney stones.  He had a greater than 40-pack-year history of smoking before quitting in 2017.  In December 2020 had a low-dose CT for lung cancer screening.  There was a groundglass opacity in the left lower lobe.  On follow-up in December 2021 there was an increase in size and some area of solid component.  Mean diameter was about 30 mm.  Dr. Valeta Harms did bronchoscopy and there were atypical cells probable adenocarcinoma.  On PET CT there was hypermetabolism in the T11 vertebral body but no CT correlate.  An MRI of the spine showed that the compression fracture.  He had a biopsy and kyphoplasty of that vertebral body by interventional radiology.  Biopsy showed no evidence of cancer.   He remains very anxious about the possible diagnosis.  He denies chest pain, pressure, or tightness.  He is not having any shortness of breath, coughing or wheezing.  He is physically active.  Hospital course: Patient was admitted electively and taken the operating room and underwent a robotic assisted thoracoscopic surgery with left lower lobectomy.  Additionally there was lymph node dissection.  Patient tolerated procedure well was taken to the postanesthesia care unit in stable condition.  Postoperative hospital course: After recovery in the PACU, he was transferred to [facility].  His respiratory status remained stable.  He had minimal drainage from the chest tube overnight and no air leak on postop day 1.  The chest tube was removed.  Follow-up chest x-ray was satisfactory with no pneumothorax.  On postop day 2, he had some abdominal distention without abdominal pain.  He was treated with lactulose and Reglan.  Ambulation was encouraged.

## 2020-11-02 NOTE — Discharge Instructions (Signed)

## 2020-11-03 ENCOUNTER — Inpatient Hospital Stay (HOSPITAL_COMMUNITY): Payer: Medicare HMO

## 2020-11-03 ENCOUNTER — Encounter (HOSPITAL_COMMUNITY): Payer: Self-pay | Admitting: Thoracic Surgery (Cardiothoracic Vascular Surgery)

## 2020-11-03 LAB — CBC
HCT: 42.4 % (ref 39.0–52.0)
Hemoglobin: 14.2 g/dL (ref 13.0–17.0)
MCH: 32.2 pg (ref 26.0–34.0)
MCHC: 33.5 g/dL (ref 30.0–36.0)
MCV: 96.1 fL (ref 80.0–100.0)
Platelets: 133 10*3/uL — ABNORMAL LOW (ref 150–400)
RBC: 4.41 MIL/uL (ref 4.22–5.81)
RDW: 13.1 % (ref 11.5–15.5)
WBC: 15.6 10*3/uL — ABNORMAL HIGH (ref 4.0–10.5)
nRBC: 0 % (ref 0.0–0.2)

## 2020-11-03 LAB — BASIC METABOLIC PANEL
Anion gap: 9 (ref 5–15)
BUN: 17 mg/dL (ref 8–23)
CO2: 25 mmol/L (ref 22–32)
Calcium: 8.8 mg/dL — ABNORMAL LOW (ref 8.9–10.3)
Chloride: 102 mmol/L (ref 98–111)
Creatinine, Ser: 1.3 mg/dL — ABNORMAL HIGH (ref 0.61–1.24)
GFR, Estimated: 60 mL/min — ABNORMAL LOW (ref >60)
Glucose, Bld: 145 mg/dL — ABNORMAL HIGH (ref 70–99)
Potassium: 4.1 mmol/L (ref 3.5–5.1)
Sodium: 136 mmol/L (ref 135–145)

## 2020-11-03 MED ORDER — ALUM & MAG HYDROXIDE-SIMETH 200-200-20 MG/5ML PO SUSP
15.0000 mL | Freq: Four times a day (QID) | ORAL | Status: DC | PRN
  Administered 2020-11-03 – 2020-11-04 (×3): 15 mL via ORAL
  Filled 2020-11-03 (×3): qty 30

## 2020-11-03 MED ORDER — ALBUTEROL SULFATE (2.5 MG/3ML) 0.083% IN NEBU: 2.5000 mg | INHALATION_SOLUTION | RESPIRATORY_TRACT | Status: DC | PRN

## 2020-11-03 MED ORDER — ENOXAPARIN SODIUM 40 MG/0.4ML ~~LOC~~ SOLN
40.0000 mg | Freq: Every day | SUBCUTANEOUS | Status: DC
  Administered 2020-11-05: 40 mg via SUBCUTANEOUS
  Filled 2020-11-03: qty 0.4

## 2020-11-03 MED ORDER — CHLORHEXIDINE GLUCONATE CLOTH 2 % EX PADS
6.0000 | MEDICATED_PAD | Freq: Every day | CUTANEOUS | Status: DC
  Administered 2020-11-03 – 2020-11-04 (×2): 6 via TOPICAL

## 2020-11-03 NOTE — Progress Notes (Signed)
1 Day Post-Op Procedure(s) (LRB): XI ROBOTIC ASSISTED THORASCOPY-LEFT LOWER LOBECTOMY (Left) INTERCOSTAL NERVE BLOCK (Left) LYMPH NODE DISSECTION (Left) Subjective: No complaints  Objective: Vital signs in last 24 hours: Temp:  [97.1 F (36.2 C)-98.5 F (36.9 C)] 97.7 F (36.5 C) (03/05 0741) Pulse Rate:  [58-86] 61 (03/05 0741) Cardiac Rhythm: Normal sinus rhythm (03/05 0741) Resp:  [10-19] 16 (03/05 0741) BP: (93-136)/(55-97) 115/64 (03/05 0741) SpO2:  [94 %-98 %] 96 % (03/05 0741) Arterial Line BP: (108-145)/(54-66) 120/61 (03/04 1330)  Hemodynamic parameters for last 24 hours:    Intake/Output from previous day: 03/04 0701 - 03/05 0700 In: 1961.6 [P.O.:360; I.V.:1501.6; IV Piggyback:100] Out: 1055 [Urine:625; Blood:150; Chest Tube:280] Intake/Output this shift: Total I/O In: 240 [P.O.:240] Out: -   General appearance: alert, cooperative and no distress Neurologic: intact Heart: regular rate and rhythm Lungs: diminished breath sounds left abse Abdomen: normal findings: soft, non-tender No air leak  Lab Results: Recent Labs    10/31/20 1206 11/03/20 0058  WBC 8.8 15.6*  HGB 15.5 14.2  HCT 45.9 42.4  PLT 144* 133*   BMET:  Recent Labs    10/31/20 1206 11/03/20 0058  NA 138 136  K 4.1 4.1  CL 110 102  CO2 19* 25  GLUCOSE 100* 145*  BUN 18 17  CREATININE 0.94 1.30*  CALCIUM 9.4 8.8*    PT/INR:  Recent Labs    10/31/20 1206  LABPROT 12.5  INR 1.0   ABG    Component Value Date/Time   PHART 7.432 10/31/2020 1222   HCO3 21.1 10/31/2020 1222   ACIDBASEDEF 2.5 (H) 10/31/2020 1222   O2SAT 97.0 10/31/2020 1222   CBG (last 3)  No results for input(s): GLUCAP in the last 72 hours.  Assessment/Plan: S/P Procedure(s) (LRB): XI ROBOTIC ASSISTED THORASCOPY-LEFT LOWER LOBECTOMY (Left) INTERCOSTAL NERVE BLOCK (Left) LYMPH NODE DISSECTION (Left) Doing well POD # 1  Minimal drainage and no air leak- dc chest tube Pain well controlled-  Creatinine  up to 1.3 (baseline 1.1)- will stop Toradol Mobilize IS SCD + enoxaparin + ambulation for DVT prophylaxis   LOS: 1 day    Melrose Nakayama 11/03/2020

## 2020-11-03 NOTE — Op Note (Signed)
NAMEJAXXEN, VOONG MEDICAL RECORD NO: [id number] ACCOUNT NO: [id number] DATE OF BIRTH: June 01, 1951 FACILITY: [facility] LOCATION: [facility] PHYSICIAN: Revonda Standard. Roxan Hockey, MD  Operative Report   DATE OF PROCEDURE: 11/02/2020  PREOPERATIVE DIAGNOSIS:  Left lower lobe lung mass, probable adenocarcinoma.  POSTOPERATIVE DIAGNOSIS:  Adenocarcinoma, left lower lobe, clinical stage IB (T2, N0).  PROCEDURE PERFORMED:   Xi robotic-assisted left lower lobectomy, Lymph node dissection and Intercostal nerve blocks levels 3 through 10.  SURGEON:  Revonda Standard. Roxan Hockey, MD  ASSISTANT:  Jadene Pierini, PA  ANESTHESIA:  General.  FINDINGS:  Mass visible in the posterior aspect of left lower lobe.  Normal-appearing lymph nodes.  Frozen section revealed adenocarcinoma.  Bronchial margin was free of tumor.  CLINICAL NOTE:  Mr. Guo is a 70 year old man with a history of tobacco abuse.  He was found to have a left lower lobe ground-glass opacity on a CT for lung cancer screening back in 08/2019.  On followup, there was an increase in size as well as some  component that was solid with a mean diameter of approximately 3 cm.  Bronchoscopy showed atypical cells highly suspicious for adenocarcinoma.  On his metastatic workup, there was a question of a lesion at the T11 vertebral body.  Biopsies of that were  negative.  He was offered the option of left lower lobectomy.  The indications, risks, benefits, and alternatives were discussed in detail with the patient.  He understood and accepted the risks and agreed to proceed.  OPERATIVE NOTE:  Mr. Strick was brought to the preoperative holding area on 11/02/2020.  Anesthesia placed a central venous catheter and an arterial blood pressure monitoring line.  He was taken to the operating room and anesthetized and intubated with a double lumen endotracheal tube.  A  Foley catheter was placed.  Sequential compression devices were placed on the calves for DVT prophylaxis.   Intravenous antibiotics were administered.  He was placed in a right lateral decubitus position.  A Bair Hugger was placed for active warming.  The left chest was prepped and draped in the usual sterile fashion.  Single lung ventilation of the right lung was initiated and was tolerated well throughout the procedure.  A timeout was performed.  A solution containing 20 mL of liposomal bupivacaine, 30 mL of 0.5% bupivacaine and 50 mL of saline was prepared.  This was used for local anesthesia at the incision sites as well as for the intercostal nerve blocks.  An  incision was made in the eighth interspace in the mid axillary line.  An 8 mm port was inserted.  The thoracoscope was advanced into the chest. After confirming intrapleural placement, carbon dioxide was insufflated per protocol.  A 12 mm port was  placed in the eighth interspace anteriorly.  Intercostal nerve blocks then were performed from the 3rd to the 10th interspace.  A needle was placed from a posterior approach and 10 mL of the bupivacaine solution was injected into a subpleural plane at  each level.  Two additional eighth interspace ports were placed.  A 12 mm port was placed 5 cm posterior to the camera port and an 8 mm port was placed 5 cm posterior to that.  A 12 mm AirSeal port was placed in the tenth interspace posterolaterally.   The robotic instruments were inserted with thoracoscopic visualization.  The lower lobe was retracted superiorly and the inferior ligament was divided with bipolar cautery.  All the lymph nodes that were encountered during the dissection were dissected  out, removed and sent as separate specimens for permanent pathology.  All  appeared grossly benign.  The pleural reflection then was divided at the hilum posteriorly and level 7 nodes were removed. As the dissection continued superiorly, there were adhesions near the apex.  Initial attempts were made to take these adhesions  down, but it was felt that there  was no point in further dissection in that area and the majority of the adhesions were left in place.  There were some adhesions of the lingula to the pericardial fat pad and the pericardium, which were minimal.  These  were taken down with bipolar cautery.  The fissure was inspected.  The fissure was nearly complete.  There were small segments of incomplete fissure at the anterior and posterior extremes.  These were each divided with single firings of the robotic  stapler using blue cartridges.  The dissection was carried out along the basilar segmental pulmonary artery up to the main pulmonary artery.  After completing the fissure posteriorly, there was a node at the takeoff of the superior segmental pulmonary  artery branch.  This node was dissected out and then the superior segmental pulmonary artery branch was easily encircled and divided with the vascular stapler.  The basilar segmental trunk divided into 2 large branches nearly immediately.  These were  dissected out separately, but then encircled and divided with the vascular stapler together.  The inferior vein then was encircled, it was likewise divided with the endoscopic vascular stapler.  Next, the robotic stapler was placed across the left lower  lobe bronchus at its origin.  A green cartridge was used on the stapler for the bronchus.  The stapler was closed.  A test inflation showed good aeration of the upper lobe.  The stapler was fired transecting the bronchus.  The upper lobe then was retracted posteriorly and the level 5 nodes were dissected out from the aortopulmonary window.  The sponges and vessel loop that had been placed were removed.  The robotic instruments were removed and the robot was undocked.  The  anterior eighth interspace incision was lengthened to 3 cm.  A 15 mm endoscopic retrieval bag was advanced into the chest.  The lower lobe was placed into the bag, which was then removed through the anterior incision.  It was sent  for frozen section on  the mass and the margins.  Findings as noted above.  Chest was copiously irrigated with warm saline.  A test inflation to 30 cm of water revealed no leakage from the staple lines or the bronchial stump.  There was bleeding from the posterior port incision.  Attempts to stop that with the cautery were  unsuccessful and multiple clips were placed on the intercostal vessels at the site of the posterior port.  A 28-French Blake drain was advanced through the original port incision and directed towards the apex, it was secured to the skin with a #1 silk  suture.  Dual lung ventilation was resumed.  The remaining incisions were closed in standard fashion.  Dermabond was applied.  The chest tube was placed to a Pleur-Evac on waterseal.  The patient was extubated in the operating room and taken to the  Saline Unit in good condition.  All sponge, needle and instrument counts were correct at the end of the procedure.   PAA D: 11/02/2020 1:50:30 pm T: 11/03/2020 5:05:00 am  JOB: [id number]

## 2020-11-04 ENCOUNTER — Inpatient Hospital Stay (HOSPITAL_COMMUNITY): Payer: Medicare HMO

## 2020-11-04 LAB — CBC
HCT: 41.2 % (ref 39.0–52.0)
Hemoglobin: 13.6 g/dL (ref 13.0–17.0)
MCH: 31.7 pg (ref 26.0–34.0)
MCHC: 33 g/dL (ref 30.0–36.0)
MCV: 96 fL (ref 80.0–100.0)
Platelets: 117 10*3/uL — ABNORMAL LOW (ref 150–400)
RBC: 4.29 MIL/uL (ref 4.22–5.81)
RDW: 13.5 % (ref 11.5–15.5)
WBC: 13.7 10*3/uL — ABNORMAL HIGH (ref 4.0–10.5)
nRBC: 0 % (ref 0.0–0.2)

## 2020-11-04 LAB — COMPREHENSIVE METABOLIC PANEL
ALT: 26 U/L (ref 0–44)
AST: 21 U/L (ref 15–41)
Albumin: 3.2 g/dL — ABNORMAL LOW (ref 3.5–5.0)
Alkaline Phosphatase: 90 U/L (ref 38–126)
Anion gap: 8 (ref 5–15)
BUN: 16 mg/dL (ref 8–23)
CO2: 26 mmol/L (ref 22–32)
Calcium: 8.8 mg/dL — ABNORMAL LOW (ref 8.9–10.3)
Chloride: 103 mmol/L (ref 98–111)
Creatinine, Ser: 0.99 mg/dL (ref 0.61–1.24)
GFR, Estimated: >60 mL/min (ref >60)
Glucose, Bld: 115 mg/dL — ABNORMAL HIGH (ref 70–99)
Potassium: 4.1 mmol/L (ref 3.5–5.1)
Sodium: 137 mmol/L (ref 135–145)
Total Bilirubin: 0.9 mg/dL (ref 0.3–1.2)
Total Protein: 6.2 g/dL — ABNORMAL LOW (ref 6.5–8.1)

## 2020-11-04 MED ORDER — METOCLOPRAMIDE HCL 5 MG/ML IJ SOLN: 10.0000 mg | Freq: Four times a day (QID) | INTRAMUSCULAR | Status: AC

## 2020-11-04 MED ORDER — FLEET ENEMA 7-19 GM/118ML RE ENEM
1.0000 | ENEMA | Freq: Every day | RECTAL | Status: DC | PRN
  Filled 2020-11-04: qty 1

## 2020-11-04 MED ORDER — LACTULOSE 10 GM/15ML PO SOLN
20.0000 g | Freq: Once | ORAL | Status: AC
  Administered 2020-11-04: 20 g via ORAL
  Filled 2020-11-04: qty 30

## 2020-11-04 MED ORDER — GABAPENTIN 100 MG PO CAPS
100.0000 mg | ORAL_CAPSULE | Freq: Two times a day (BID) | ORAL | Status: DC
  Administered 2020-11-04 – 2020-11-05 (×3): 100 mg via ORAL
  Filled 2020-11-04 (×2): qty 1

## 2020-11-04 NOTE — Progress Notes (Signed)
2 Days Post-Op Procedure(s) (LRB): XI ROBOTIC ASSISTED THORASCOPY-LEFT LOWER LOBECTOMY (Left) INTERCOSTAL NERVE BLOCK (Left) LYMPH NODE DISSECTION (Left) Subjective: Still some indigestion/ gas, some incisional pain  Objective: Vital signs in last 24 hours: Temp:  [97.9 F (36.6 C)-98.6 F (37 C)] 98.6 F (37 C) (03/06 0744) Pulse Rate:  [60-79] 79 (03/06 0744) Cardiac Rhythm: Normal sinus rhythm (03/06 0722) Resp:  [16-20] 18 (03/06 0744) BP: (110-139)/(68-80) 120/70 (03/06 0744) SpO2:  [94 %-97 %] 94 % (03/06 0744)  Hemodynamic parameters for last 24 hours:    Intake/Output from previous day: 03/05 0701 - 03/06 0700 In: 480 [P.O.:480] Out: 1375 [Urine:1375] Intake/Output this shift: Total I/O In: 240 [P.O.:240] Out: -   General appearance: alert, cooperative and no distress Neurologic: intact Heart: regular rate and rhythm Lungs: diminished breath sounds left base Wound: clean and dry  Lab Results: Recent Labs    11/03/20 0058 11/04/20 0202  WBC 15.6* 13.7*  HGB 14.2 13.6  HCT 42.4 41.2  PLT 133* 117*   BMET:  Recent Labs    11/03/20 0058 11/04/20 0202  NA 136 137  K 4.1 4.1  CL 102 103  CO2 25 26  GLUCOSE 145* 115*  BUN 17 16  CREATININE 1.30* 0.99  CALCIUM 8.8* 8.8*    PT/INR: No results for input(s): LABPROT, INR in the last 72 hours. ABG    Component Value Date/Time   PHART 7.432 10/31/2020 1222   HCO3 21.1 10/31/2020 1222   ACIDBASEDEF 2.5 (H) 10/31/2020 1222   O2SAT 97.0 10/31/2020 1222   CBG (last 3)  No results for input(s): GLUCAP in the last 72 hours.  Assessment/Plan: S/P Procedure(s) (LRB): XI ROBOTIC ASSISTED THORASCOPY-LEFT LOWER LOBECTOMY (Left) INTERCOSTAL NERVE BLOCK (Left) LYMPH NODE DISSECTION (Left) -Overall doing well Needs to ambulate Renal function back to baseline Will give lactulose, Fleet enema if needed   LOS: 2 days    Melrose Nakayama 11/04/2020

## 2020-11-04 NOTE — Progress Notes (Deleted)
      [address]       [place] [address]             [phone]      2 Days Post-Op Procedure(s) (LRB): XI ROBOTIC ASSISTED THORASCOPY-LEFT LOWER LOBECTOMY (Left) INTERCOSTAL NERVE BLOCK (Left) LYMPH NODE DISSECTION (Left) Subjective: Awake and alert, c/o abdominal distension. Passing gas, no BM yet.  Chest tube removed yesterday.   Objective: Vital signs in last 24 hours: Temp:  [97.9 F (36.6 C)-98.6 F (37 C)] 98.6 F (37 C) (03/06 0744) Pulse Rate:  [60-79] 79 (03/06 0744) Cardiac Rhythm: Normal sinus rhythm (03/06 0722) Resp:  [16-20] 18 (03/06 0744) BP: (110-139)/(68-80) 120/70 (03/06 0744) SpO2:  [94 %-97 %] 94 % (03/06 0744)     Intake/Output from previous day: 03/05 0701 - 03/06 0700 In: 480 [P.O.:480] Out: 1375 [Urine:1375] Intake/Output this shift: Total I/O In: 240 [P.O.:240] Out: -   General appearance: mild distress Neurologic: intact Heart: RRR Lungs: Breath sounds clear and full. CXR with bibasilar ATX, no PTX post CT removal.  Abdomen: Firm, mild distension. No tenderness.  Wound: Port incisions intact and dry. Expected bruising. CT site is dry.   Lab Results: Recent Labs    11/03/20 0058 11/04/20 0202  WBC 15.6* 13.7*  HGB 14.2 13.6  HCT 42.4 41.2  PLT 133* 117*   BMET:  Recent Labs    11/03/20 0058 11/04/20 0202  NA 136 137  K 4.1 4.1  CL 102 103  CO2 25 26  GLUCOSE 145* 115*  BUN 17 16  CREATININE 1.30* 0.99  CALCIUM 8.8* 8.8*    PT/INR: No results for input(s): LABPROT, INR in the last 72 hours. ABG    Component Value Date/Time   PHART 7.432 10/31/2020 1222   HCO3 21.1 10/31/2020 1222   ACIDBASEDEF 2.5 (H) 10/31/2020 1222   O2SAT 97.0 10/31/2020 1222   CBG (last 3)  No results for input(s): GLUCAP in the last 72 hours.  Assessment/Plan: S/P Procedure(s) (LRB): XI ROBOTIC ASSISTED THORASCOPY-LEFT LOWER LOBECTOMY (Left) INTERCOSTAL NERVE BLOCK (Left) LYMPH NODE DISSECTION (Left)  -POD2  robot-assisted left lower lobectomy for stage IB adenocarcinoma. CT removed yesterday and CXR stable. Work on pulmonary hygiene, wean O2. Mobilize.   -Abd distension- No tenderness. Increase activity. Reglan 10mg  IV q6h x 4 doses today.   -DVT PPX- on daily enoxaparin.     LOS: 2 days    Antony Odea, PA-C [phone] 11/04/2020

## 2020-11-05 ENCOUNTER — Inpatient Hospital Stay (HOSPITAL_COMMUNITY): Payer: Medicare HMO

## 2020-11-05 DIAGNOSIS — R0602 Shortness of breath: Secondary | ICD-10-CM | POA: Diagnosis not present

## 2020-11-05 LAB — SURGICAL PATHOLOGY

## 2020-11-05 MED ORDER — OXYCODONE HCL 5 MG PO TABS: 5.0000 mg | ORAL_TABLET | ORAL | 0 refills | Status: DC | PRN

## 2020-11-05 NOTE — Care Management Important Message (Signed)
Important Message  Patient Details  Name: Isaac Carrillo MRN: [id number] Date of Birth: 12-14-1950   Medicare Important Message Given:  Yes     Orbie Pyo 11/05/2020, 3:27 PM

## 2020-11-05 NOTE — Plan of Care (Signed)

## 2020-11-05 NOTE — Progress Notes (Signed)
SATURATION QUALIFICATIONS: (This note is used to comply with regulatory documentation for home oxygen)  Patient Saturations on Room Air at Rest = 95%  Patient Saturations on Room Air while Ambulating = 88%  Please briefly explain why patient needs home oxygen:  Patient may need oxygen for ambulation.

## 2020-11-05 NOTE — Progress Notes (Addendum)
      [address]       [place] [address]             [phone]       3 Days Post-Op Procedure(s) (LRB): XI ROBOTIC ASSISTED THORASCOPY-LEFT LOWER LOBECTOMY (Left) INTERCOSTAL NERVE BLOCK (Left) LYMPH NODE DISSECTION (Left)  Subjective: Patient had multiple bowel movements. He denies nausea, vomiting, or abdominal pain. He feels much better  Objective: Vital signs in last 24 hours: Temp:  [97.4 F (36.3 C)-98.6 F (37 C)] 98.3 F (36.8 C) (03/07 0300) Pulse Rate:  [72-79] 77 (03/07 0300) Cardiac Rhythm: Normal sinus rhythm (03/07 0400) Resp:  [14-20] 15 (03/07 0300) BP: (109-146)/(68-81) 109/73 (03/07 0300) SpO2:  [90 %-95 %] 90 % (03/07 0300)      Intake/Output from previous day: 03/06 0701 - 03/07 0700 In: 720 [P.O.:720] Out: 550 [Urine:550]   Physical Exam:  Cardiovascular: RRR, Pulmonary: Clear to auscultation bilaterally Abdomen: Soft, non tender, ++ bowel sounds present. Extremities: SCDs in place Wounds: Clean and dry.  No erythema or signs of infection.   Lab Results: MMN:OTRRNH Labs    11/03/20 0058 11/04/20 0202  WBC 15.6* 13.7*  HGB 14.2 13.6  HCT 42.4 41.2  PLT 133* 117*   BMET:  Recent Labs    11/03/20 0058 11/04/20 0202  NA 136 137  K 4.1 4.1  CL 102 103  CO2 25 26  GLUCOSE 145* 115*  BUN 17 16  CREATININE 1.30* 0.99  CALCIUM 8.8* 8.8*    PT/INR: No results for input(s): LABPROT, INR in the last 72 hours. ABG:  INR: Will add last result for INR, ABG once components are confirmed Will add last 4 CBG results once components are confirmed  Assessment/Plan:  1. CV - SR with HR in the 70's. 2.  Pulmonary - History of COPD. On 2 liters via [place]. Wean to room air. CXR this am appears to show atelectasis and dilated bowel loops. Encourage incentive spirometer 3. GI-multiple bowel movements yesterday. Semi formed then loose.  4. Will discuss disposition with Dr. Edman Circle M  ZimmermanPA-C 11/05/2020,7:08 AM [phone]  Patient seen and examined, agree with above Doing well Will see how is feeling around noon. If no issues will dc home  Remo Lipps C. Roxan Hockey, MD [facility] [phone]

## 2020-11-08 ENCOUNTER — Telehealth: Payer: Self-pay

## 2020-11-08 ENCOUNTER — Other Ambulatory Visit: Payer: Self-pay | Admitting: *Deleted

## 2020-11-08 NOTE — Progress Notes (Signed)
The proposed treatment discussed in cancer conference is for discussion purpose only and is not a binding recommendation. The patient was not physically examined nor present for their treatment options. Therefore, final treatment plans cannot be decided.  ?

## 2020-11-08 NOTE — Telephone Encounter (Signed)
Patient contacted the office concerned about mild swelling tenderness around incision site.  Patient is s/p Left RATS, LULobectomy with Dr. Roxan Hockey 11/02/20.  Patient advised to send picture to office phone for observation.  Incisions look to be healing nicely.  Patient still has suture intact, which appointment was made for patient to have removed next week.  Very mild swelling observed when looking at picture from the front, looks more due to incision and closer of incision.  Advised that everything looks to be healing nicely and some minor swelling is to be expected as patient surgery less than a week ago.  Patient acknowledged receipt.

## 2020-11-13 ENCOUNTER — Ambulatory Visit (INDEPENDENT_AMBULATORY_CARE_PROVIDER_SITE_OTHER): Payer: Self-pay

## 2020-11-13 ENCOUNTER — Other Ambulatory Visit: Payer: Self-pay

## 2020-11-13 DIAGNOSIS — Z4802 Encounter for removal of sutures: Secondary | ICD-10-CM

## 2020-11-13 NOTE — Progress Notes (Signed)
Patient arrived for nurse visit to remove suture/staples post- procedure Left RATS lower lobectomy with Dr. Roxan Hockey 11/02/20.  One Suture removed with no signs/ symptoms of infection noted.  Patient tolerated procedure well.  Incisions healing nicely. Patient/ family instructed to keep the incision sites clean and dry.  Patient/ family acknowledged instructions given. Patient stated that he is still take his Oxycodone once a day, mainly at night to help with pain and sleeping.  He stated that he is experiencing some swelling near his incision site and left flank, advised that some swelling after surgery is normal, but that if the swelling does not resolve or becomes worse in the meantime, contact the office for further instructions.  He acknowledged receipt.

## 2020-11-19 ENCOUNTER — Other Ambulatory Visit: Payer: Self-pay | Admitting: Thoracic Surgery (Cardiothoracic Vascular Surgery)

## 2020-11-19 DIAGNOSIS — C3430 Malignant neoplasm of lower lobe, unspecified bronchus or lung: Secondary | ICD-10-CM

## 2020-11-20 ENCOUNTER — Encounter: Payer: Self-pay | Admitting: *Deleted

## 2020-11-20 ENCOUNTER — Ambulatory Visit (INDEPENDENT_AMBULATORY_CARE_PROVIDER_SITE_OTHER): Payer: Self-pay | Admitting: Thoracic Surgery (Cardiothoracic Vascular Surgery)

## 2020-11-20 ENCOUNTER — Ambulatory Visit
Admission: RE | Admit: 2020-11-20 | Discharge: 2020-11-20 | Disposition: A | Discharge status: Home / Self Care | Payer: Medicare HMO | Source: Ambulatory Visit | Attending: Thoracic Surgery (Cardiothoracic Vascular Surgery) | Admitting: Thoracic Surgery (Cardiothoracic Vascular Surgery)

## 2020-11-20 ENCOUNTER — Telehealth: Payer: Self-pay | Admitting: Internal Medicine

## 2020-11-20 ENCOUNTER — Other Ambulatory Visit: Payer: Self-pay

## 2020-11-20 ENCOUNTER — Encounter: Payer: Self-pay | Admitting: Thoracic Surgery (Cardiothoracic Vascular Surgery)

## 2020-11-20 VITALS — BP 117/72 | HR 77 | Resp 20 | Ht 76.0 in | Wt 238.0 lb

## 2020-11-20 DIAGNOSIS — C3432 Malignant neoplasm of lower lobe, left bronchus or lung: Secondary | ICD-10-CM

## 2020-11-20 DIAGNOSIS — C3492 Malignant neoplasm of unspecified part of left bronchus or lung: Secondary | ICD-10-CM

## 2020-11-20 DIAGNOSIS — R0602 Shortness of breath: Secondary | ICD-10-CM | POA: Diagnosis not present

## 2020-11-20 DIAGNOSIS — R079 Chest pain, unspecified: Secondary | ICD-10-CM | POA: Diagnosis not present

## 2020-11-20 DIAGNOSIS — Z09 Encounter for follow-up examination after completed treatment for conditions other than malignant neoplasm: Secondary | ICD-10-CM

## 2020-11-20 DIAGNOSIS — C3430 Malignant neoplasm of lower lobe, unspecified bronchus or lung: Secondary | ICD-10-CM

## 2020-11-20 MED ORDER — TRAMADOL HCL 50 MG PO TABS: 50.0000 mg | ORAL_TABLET | Freq: Four times a day (QID) | ORAL | 0 refills | Status: DC | PRN

## 2020-11-20 NOTE — Progress Notes (Signed)
[address]       [name]             [phone]     HPI: Isaac Carrillo returns for scheduled postoperative follow-up visit  Isaac Carrillo is a 70 year old man old man with a history of tobacco abuse, COPD, anxiety, and kidney stones.  He was being followed in the low-dose CT screening program.  He was found to have a groundglass opacity in the left lower lobe.  Follow-up scan a year later showed that had increased in size.  Dr. Valeta Harms did bronchoscopy which showed atypical cells worrisome for adenocarcinoma.  A PET/CT showed activity in the T11 vertebral body with a compression fracture but no mass was noted.  Biopsy was negative and he had kyphoplasty by IR.  I did a robotic assisted left lower lobectomy on 11/02/2020.  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  All the nodes were negative.  He did well postoperatively and went home on day 3.  He has been taking oxycodone at night before he goes to bed.  He used his last one last night.  He would like to try something a little milder but feels like he still needs something to help him sleep at night.  He does complain of some discomfort and bulging in the left upper quadrant.  Past Medical History:  Diagnosis Date  . Anxiety   . Centrilobular emphysema ([place])    Pt denies 10/31/20  . COPD (chronic obstructive pulmonary disease) ([place])    Pt denies 10/31/20  . Former smoker   . History of kidney stones     x2 passed    Current Outpatient Medications  Medication Sig Dispense Refill  . traMADol (ULTRAM) 50 MG tablet Take 1 tablet (50 mg total) by mouth every 6 (six) hours as needed. 25 tablet 0  . atorvastatin (LIPITOR) 20 MG tablet Take 20 mg by mouth daily.    . citalopram (CELEXA) 10 MG tablet Take 10 mg by mouth daily.    . citalopram (CELEXA) 20 MG tablet Take 20 mg by mouth daily.    Marland Kitchen docusate sodium (COLACE) 100 MG capsule Take 100 mg by mouth daily as needed for mild  constipation.    Marland Kitchen ibuprofen (ADVIL,MOTRIN) 200 MG tablet Take 800 mg by mouth every 8 (eight) hours as needed for mild pain.    . sildenafil (REVATIO) 20 MG tablet Take 40-80 mg by mouth daily as needed (erectile dysfunction.).     No current facility-administered medications for this visit.    Physical Exam BP 117/72 (BP Location: Right Arm, Patient Position: Sitting)   Pulse 77   Resp 20   Ht 6\' 4"  (1.93 m)   Wt 238 lb (108 kg)   SpO2 97% Comment: RA  BMI 28.57 kg/m  70 year old man in no acute distress Alert and oriented x3 with no focal deficits Lungs diminished at left base but otherwise clear Incisions clean dry and intact Cardiac regular rate and rhythm  Diagnostic Tests: I personally reviewed the chest x-ray images.  It showed postoperative changes from a recent left lower lobectomy  Impression: Isaac Carrillo is a 70 year old man with a history of tobacco abuse, COPD, anxiety, and kidney stones.  He was found to have a groundglass opacity on a low-dose CT for lung cancer screening.  Biopsies showed atypical cells worrisome for adenocarcinoma.  I did a left lower lobectomy and that  turned out to be a ciliated muconodular papillary tumor of the lung.  All of the lymph nodes were negative.  There have been no reported metastases with CMPT of the lung.  Just to be on the safe side I am going to refer him to Dr. Julien Nordmann of oncology to get his input.  He will need to stay at least in the low-dose screening program given his smoking history.  He does have some discomfort which is to be expected.  Overall I think he is doing extremely well.  He does need some pain medication at night before he goes to bed.  He wants to try tramadol rather than oxycodone.  I will go ahead and give him a prescription for that.  He may begin driving.  Appropriate precautions were discussed.  There are no restrictions on his activities but he was cautioned to build into new activities gradually to avoid  undue discomfort.  Plan: Tramadol 50 mg p.o. every 6 hours as needed, dispense 25 tablets, no refills I will refer to Dr. Julien Nordmann to get his input.  He should not need any adjuvant therapy. Return in 2 months with PA lateral chest x-ray  Isaac Nakayama, MD [facility] [phone]

## 2020-11-20 NOTE — Telephone Encounter (Signed)
Received a new pt referral from Dr. Roxan Hockey for S/P lobectomy of lung. Isaac Carrillo has been scheduled to see Dr. Julien Nordmann on 3/30 at 2:15pm w/labs at 1:45pm. I cld and lft the appt date and time on the pt's vm. I updated the thoracic navigator.

## 2020-11-20 NOTE — Progress Notes (Signed)
I received referral on Mr. Privott today. I updated new patient coordinator to call and schedule him to be seen on 11/29/31 with labs and Dr. Julien Nordmann.

## 2020-11-28 ENCOUNTER — Inpatient Hospital Stay: Payer: Medicare HMO | Attending: Internal Medicine | Admitting: Internal Medicine

## 2020-11-28 ENCOUNTER — Encounter: Payer: Self-pay | Admitting: Internal Medicine

## 2020-11-28 ENCOUNTER — Inpatient Hospital Stay: Payer: Medicare HMO

## 2020-11-28 ENCOUNTER — Encounter: Payer: Self-pay | Admitting: *Deleted

## 2020-11-28 ENCOUNTER — Other Ambulatory Visit: Payer: Self-pay

## 2020-11-28 VITALS — BP 121/78 | HR 68 | Temp 96.9°F | Resp 18 | Wt 238.2 lb

## 2020-11-28 DIAGNOSIS — D1432 Benign neoplasm of left bronchus and lung: Secondary | ICD-10-CM | POA: Insufficient documentation

## 2020-11-28 DIAGNOSIS — Z79899 Other long term (current) drug therapy: Secondary | ICD-10-CM | POA: Insufficient documentation

## 2020-11-28 DIAGNOSIS — C3430 Malignant neoplasm of lower lobe, unspecified bronchus or lung: Secondary | ICD-10-CM

## 2020-11-28 DIAGNOSIS — C3432 Malignant neoplasm of lower lobe, left bronchus or lung: Secondary | ICD-10-CM | POA: Insufficient documentation

## 2020-11-28 DIAGNOSIS — Z87891 Personal history of nicotine dependence: Secondary | ICD-10-CM | POA: Diagnosis not present

## 2020-11-28 DIAGNOSIS — Z902 Acquired absence of lung [part of]: Secondary | ICD-10-CM | POA: Diagnosis not present

## 2020-11-28 DIAGNOSIS — C3492 Malignant neoplasm of unspecified part of left bronchus or lung: Secondary | ICD-10-CM

## 2020-11-28 LAB — CBC WITH DIFFERENTIAL (CANCER CENTER ONLY)
Abs Immature Granulocytes: 0.07 10*3/uL (ref 0.00–0.07)
Basophils Absolute: 0.1 10*3/uL (ref 0.0–0.1)
Basophils Relative: 1 %
Eosinophils Absolute: 0.3 10*3/uL (ref 0.0–0.5)
Eosinophils Relative: 4 %
HCT: 45.2 % (ref 39.0–52.0)
Hemoglobin: 15.1 g/dL (ref 13.0–17.0)
Immature Granulocytes: 1 %
Lymphocytes Relative: 29 %
Lymphs Abs: 2.6 10*3/uL (ref 0.7–4.0)
MCH: 31.1 pg (ref 26.0–34.0)
MCHC: 33.4 g/dL (ref 30.0–36.0)
MCV: 93.2 fL (ref 80.0–100.0)
Monocytes Absolute: 1 10*3/uL (ref 0.1–1.0)
Monocytes Relative: 11 %
Neutro Abs: 5.1 10*3/uL (ref 1.7–7.7)
Neutrophils Relative %: 54 %
Platelet Count: 162 10*3/uL (ref 150–400)
RBC: 4.85 MIL/uL (ref 4.22–5.81)
RDW: 12.9 % (ref 11.5–15.5)
WBC Count: 9.2 10*3/uL (ref 4.0–10.5)
nRBC: 0 % (ref 0.0–0.2)

## 2020-11-28 LAB — CMP (CANCER CENTER ONLY)
ALT: 32 U/L (ref 0–44)
AST: 17 U/L (ref 15–41)
Albumin: 3.8 g/dL (ref 3.5–5.0)
Alkaline Phosphatase: 137 U/L — ABNORMAL HIGH (ref 38–126)
Anion gap: 10 (ref 5–15)
BUN: 13 mg/dL (ref 8–23)
CO2: 25 mmol/L (ref 22–32)
Calcium: 9.2 mg/dL (ref 8.9–10.3)
Chloride: 105 mmol/L (ref 98–111)
Creatinine: 1.15 mg/dL (ref 0.61–1.24)
GFR, Estimated: >60 mL/min (ref >60)
Glucose, Bld: 89 mg/dL (ref 70–99)
Potassium: 4.3 mmol/L (ref 3.5–5.1)
Sodium: 140 mmol/L (ref 135–145)
Total Bilirubin: 0.4 mg/dL (ref 0.3–1.2)
Total Protein: 8 g/dL (ref 6.5–8.1)

## 2020-11-28 NOTE — Progress Notes (Signed)
[place] Telephone:(336) [phone]   [phone]  CONSULT NOTE  REFERRING PHYSICIAN: Dr. Modesto Charon  REASON FOR CONSULTATION:  70 years old white male with ciliated mucoid nodular papillary tumor  HPI Isaac Carrillo is a 69 y.o. male with past medical history significant for anxiety and kidney stones as well as long history of smoking but quit around 6 years ago.  The patient was seen by his primary care provider and because of his smoking history he had CT screening of the chest performed on August 06, 2020 and it showed slow progressive enlargement of a mixed subsolid and solid lesion in the posterior aspect of the left lower lobe highly concerning for possible primary bronchogenic adenocarcinoma.  The patient was referred to Dr. Valeta Harms and he underwent bronchoscopy with endobronchial navigation bronchoscopy on August 28, 2020 and the brushing and fine-needle aspiration showed atypical cells suspicious for malignancy.  A PET scan was performed on September 12, 2020 and it showed low FDG uptake in the subsolid pulmonary lesion measuring 1.4 cm.  No enlarged or hypermetabolic mediastinal or hilar lymph nodes and no significant abnormality in the abdomen or pelvis.  The patient also had moderate hypermetabolism in the T11 vertebral body without CT correlate.  He had MRI of the thoracic spine that showed acute compression fracture involving the superior endplate of S50 with up to 20% height loss without bony retropulsion.  He underwent vertebroplasty and biopsy of the lesions that was benign. The patient was referred to Dr. Roxan Hockey for consideration of surgical resection. On November 02, 2020 the patient underwent robotic assisted left lower lobectomy with lymph node dissection under the care of Dr. Roxan Hockey. 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 Dr. Roxan Hockey kindly referred the patient to me today for evaluation and recommendation regarding his condition. When seen today he continues to have mild shortness of breath with exertion and soreness on the left side of the chest from the surgical scar but no significant cough or hemoptysis.  He denied having any nausea, vomiting, diarrhea or constipation.  He denied having any headache or visual changes. Family history significant for father with diabetes and heart disease.  Mother had heart disease. The patient is single and has 2 children a son and daughter as well as 4 grandchildren.  He works at [employer].  He has a history of smoking for around 40 years but quit 6 years ago.  He has no history of alcohol or drug abuse. HPI  Past Medical History:  Diagnosis Date  . Anxiety   . Centrilobular emphysema ([place])    Pt denies 10/31/20  . COPD (chronic obstructive pulmonary disease) ([place])    Pt denies 10/31/20  . Former smoker   . History of kidney stones     x2 passed    Past Surgical History:  Procedure Laterality Date  . BRONCHIAL BIOPSY  08/28/2020   Procedure: BRONCHIAL BIOPSIES;  Surgeon: Garner Nash, DO;  Location: [facility];  Service: Pulmonary;;  . BRONCHIAL BRUSHINGS  08/28/2020   Procedure: BRONCHIAL BRUSHINGS;  Surgeon: Garner Nash, DO;  Location: [facility];  Service: Pulmonary;;  . BRONCHIAL NEEDLE ASPIRATION BIOPSY  08/28/2020   Procedure: BRONCHIAL NEEDLE ASPIRATION BIOPSIES;  Surgeon: Garner Nash, DO;  Location: [facility];  Service: Pulmonary;;  . BRONCHIAL WASHINGS  08/28/2020  Procedure: BRONCHIAL WASHINGS;  Surgeon: Garner Nash, DO;  Location: [facility];  Service: Pulmonary;;  . DENTAL SURGERY    . ENDOBRONCHIAL ULTRASOUND  08/28/2020   Procedure: ENDOBRONCHIAL ULTRASOUND;  Surgeon: Garner Nash, DO;  Location: [facility];  Service: Pulmonary;;  . INTERCOSTAL NERVE BLOCK Left  11/02/2020   Procedure: INTERCOSTAL NERVE BLOCK;  Surgeon: Melrose Nakayama, MD;  Location: [facility];  Service: Thoracic;  Laterality: Left;  . IR BONE TUMOR(S)RF ABLATION  10/15/2020  . IR KYPHO THORACIC WITH BONE BIOPSY  10/15/2020  . IR RADIOLOGIST EVAL & MGMT  10/04/2020  . LYMPH NODE DISSECTION Left 11/02/2020   Procedure: LYMPH NODE DISSECTION;  Surgeon: Melrose Nakayama, MD;  Location: [facility];  Service: Thoracic;  Laterality: Left;  Marland Kitchen VIDEO BRONCHOSCOPY WITH ENDOBRONCHIAL NAVIGATION Left 08/28/2020   Procedure: VIDEO BRONCHOSCOPY WITH ENDOBRONCHIAL NAVIGATION;  Surgeon: Garner Nash, DO;  Location: [facility];  Service: Pulmonary;  Laterality: Left;    Family History  Problem Relation Age of Onset  . Heart disease Mother   . Heart disease Father     Social History Social History   Tobacco Use  . Smoking status: Former Smoker    Packs/day: 2.50    Years: 40.00    Pack years: 100.00    Types: Cigarettes    Quit date: 03/01/2016    Years since quitting: 4.7  . Smokeless tobacco: Never Used  . Tobacco comment: Quit smoking 03/2016  Vaping Use  . Vaping Use: Never used  Substance Use Topics  . Alcohol use: No  . Drug use: Never    No Known Allergies  Current Outpatient Medications  Medication Sig Dispense Refill  . atorvastatin (LIPITOR) 20 MG tablet Take 20 mg by mouth daily.    . citalopram (CELEXA) 10 MG tablet Take 10 mg by mouth daily.    . citalopram (CELEXA) 20 MG tablet Take 20 mg by mouth daily.    Marland Kitchen docusate sodium (COLACE) 100 MG capsule Take 100 mg by mouth daily as needed for mild constipation.    Marland Kitchen ibuprofen (ADVIL,MOTRIN) 200 MG tablet Take 800 mg by mouth every 8 (eight) hours as needed for mild pain.    . sildenafil (REVATIO) 20 MG tablet Take 40-80 mg by mouth daily as needed (erectile dysfunction.).    Marland Kitchen traMADol (ULTRAM) 50 MG tablet Take 1 tablet (50 mg total) by mouth every 6 (six) hours as needed. 25 tablet 0   No current  facility-administered medications for this visit.    Review of Systems  Constitutional: positive for fatigue Eyes: negative Ears, nose, mouth, throat, and face: negative Respiratory: positive for dyspnea on exertion Cardiovascular: negative Gastrointestinal: negative Genitourinary:negative Integument/breast: negative Hematologic/lymphatic: negative Musculoskeletal:negative Neurological: negative Behavioral/Psych: negative Endocrine: negative Allergic/Immunologic: negative  Physical Exam  UEA:VWUJW, healthy, no distress, well nourished, well developed and anxious SKIN: skin color, texture, turgor are normal, no rashes or significant lesions HEAD: Normocephalic, No masses, lesions, tenderness or abnormalities EYES: normal, PERRLA, Conjunctiva are pink and non-injected EARS: External ears normal, Canals clear OROPHARYNX:no exudate, no erythema and lips, buccal mucosa, and tongue normal  NECK: supple, no adenopathy, no JVD LYMPH:  no palpable lymphadenopathy, no hepatosplenomegaly LUNGS: clear to auscultation , and palpation HEART: regular rate & rhythm, no murmurs and no gallops ABDOMEN:abdomen soft, non-tender, normal bowel sounds and no masses or organomegaly BACK: No CVA tenderness, Range of motion is normal EXTREMITIES:no joint deformities, effusion, or inflammation, no edema  NEURO: alert & oriented x  3 with fluent speech, no focal motor/sensory deficits  PERFORMANCE STATUS: ECOG 1  LABORATORY DATA: Lab Results  Component Value Date   WBC 9.2 11/28/2020   HGB 15.1 11/28/2020   HCT 45.2 11/28/2020   MCV 93.2 11/28/2020   PLT 162 11/28/2020      Chemistry      Component Value Date/Time   NA 137 11/04/2020 0202   K 4.1 11/04/2020 0202   CL 103 11/04/2020 0202   CO2 26 11/04/2020 0202   BUN 16 11/04/2020 0202   CREATININE 0.99 11/04/2020 0202      Component Value Date/Time   CALCIUM 8.8 (L) 11/04/2020 0202   ALKPHOS 90 11/04/2020 0202   AST 21 11/04/2020  0202   ALT 26 11/04/2020 0202   BILITOT 0.9 11/04/2020 0202       RADIOGRAPHIC STUDIES: DG Chest 2 View  Result Date: 11/20/2020 CLINICAL DATA:  Chest pain and shortness of breath. History of lung carcinoma EXAM: CHEST - 2 VIEW COMPARISON:  November 05, 2020 FINDINGS: There has been interval clearing of infiltrate from the right base region. There is postoperative change on the left with atelectatic change in the medial left base region. Lungs elsewhere clear. Heart size and pulmonary vascular normal. No adenopathy. Status post kyphoplasty procedure at T11. No blastic or lytic bone lesions. IMPRESSION: Atelectasis left base with postoperative changes on the left. Interval clearing of right base infiltrate. No new opacity. Stable cardiac silhouette. Electronically Signed   By: Lowella Grip III M.D.   On: 11/20/2020 09:14   DG Chest 2 View  Result Date: 11/05/2020 CLINICAL DATA:  70 year old male status post lobectomy of the lung. Shortness of breath. EXAM: CHEST - 2 VIEW COMPARISON:  Chest x-ray 11/04/2020. FINDINGS: Postoperative changes of prior left lower lobectomy. New opacity in the lower right lung, which appears to correspond to some increased density projecting over the lower thoracic spine on the lateral view, concerning for developing right lower lobe pneumonia. No pneumothorax. No pleural effusions. Mild elevation of the left hemidiaphragm. No evidence of pulmonary edema. Heart size is normal. Upper mediastinal contours are within normal limits. IMPRESSION: 1. Findings are concerning for developing right lower lobe pneumonia. Followup PA and lateral chest X-ray is recommended in 3-4 weeks following trial of antibiotic therapy to ensure resolution and exclude underlying malignancy. 2. Postoperative changes, similar to the prior study, as above. Electronically Signed   By: Vinnie Langton M.D.   On: 11/05/2020 08:01   DG Chest 2 View  Result Date: 11/04/2020 CLINICAL DATA:  70 year old male  status post lobectomy. EXAM: CHEST - 2 VIEW COMPARISON:  11/03/2020 FINDINGS: The heart size and mediastinal contours are within normal limits, unchanged. Bibasilar subsegmental atelectasis, right greater than left. No pneumothorax or significant pleural effusions. Surgical clips are again seen projecting over the left inferior hemithorax. No acute osseous abnormality. IMPRESSION: Bibasilar subsegmental atelectasis.  No pneumothorax. Electronically Signed   By: Ruthann Cancer MD   On: 11/04/2020 08:08   DG Chest 2 View  Result Date: 11/01/2020 CLINICAL DATA:  Preop for left lobectomy. EXAM: CHEST - 2 VIEW COMPARISON:  August 28, 2020. FINDINGS: The heart size and mediastinal contours are within normal limits. Both lungs are clear. The visualized skeletal structures are unremarkable. IMPRESSION: No active cardiopulmonary disease. Electronically Signed   By: Marijo Conception M.D.   On: 11/01/2020 10:19   DG Chest 1V REPEAT Same Day  Result Date: 11/03/2020 CLINICAL DATA:  Status post lobectomy of lung.  Chest tube. EXAM: CHEST - 1 VIEW SAME DAY COMPARISON:  November 03, 2020 FINDINGS: The left chest tube is been removed. No pneumothorax identified. The heart, hila, mediastinum, lungs, and pleura are unremarkable. IMPRESSION: No pneumothorax after left chest tube removal. Electronically Signed   By: Dorise Bullion III M.D   On: 11/03/2020 14:37   DG CHEST PORT 1 VIEW  Result Date: 11/03/2020 CLINICAL DATA:  Status post left lower lobectomy. EXAM: PORTABLE CHEST 1 VIEW COMPARISON:  November 02, 2020 FINDINGS: The left chest tube is stable. The patient is status post left lower lobectomy. No pneumothorax identified. The cardiomediastinal silhouette is stable the right lung remains clear. IMPRESSION: Left chest tube without pneumothorax. Postoperative changes. No other acute abnormalities. Electronically Signed   By: Dorise Bullion III M.D   On: 11/03/2020 08:54   DG Chest Port 1 View  Result Date:  11/02/2020 CLINICAL DATA:  Status post left lower lobectomy. EXAM: PORTABLE CHEST 1 VIEW COMPARISON:  October 31, 2020. FINDINGS: The heart size and mediastinal contours are within normal limits. Left-sided chest tube is noted without pneumothorax. Hypoinflation of the lungs is noted with mild bibasilar subsegmental atelectasis. Small left pleural effusion may be present. The visualized skeletal structures are unremarkable. IMPRESSION: Left-sided chest tube is noted without pneumothorax. Hypoinflation of the lungs is noted with mild bibasilar subsegmental atelectasis. Small left pleural effusion may be present. Electronically Signed   By: Marijo Conception M.D.   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 Roxan Hockey on November 02, 2020.   PLAN: I had a lengthy discussion with the patient today about his current condition as well as treatment options. I explained to the patient that the ciliated mucoid nodular papillary tumor is a benign condition that has been called in the past as papillary adenoma. The risk of recurrence or metastasis after surgical resection is very uncommon. I recommended for the patient to continue his routine follow-up visit and evaluation by his primary care provider at this point. I do not see a role for any adjuvant treatment for this patient. Because of his smoking history, he may continue to benefit from the CT screening of the lung. I will see him on as-needed basis at this point. The patient was advised to call immediately if he has any other concerning issues in the interval.  The patient voices understanding of current disease status and treatment options and is in agreement with the current care plan.  All questions were answered. The patient knows to call the  clinic with any problems, questions or concerns. We can certainly see the patient much sooner if necessary.  Thank you so much for allowing me to participate in the care of Isaac Carrillo. I will continue to follow up the patient with you and assist in his care.  The total time spent in the appointment was 60 minutes.  Disclaimer: This note was dictated with voice recognition software. Similar sounding words can inadvertently be transcribed and may not be corrected upon review.   Eilleen Kempf November 28, 2020, 2:20 PM

## 2020-11-28 NOTE — Progress Notes (Signed)
Oncology Nurse Navigator Documentation  Oncology Nurse Navigator Flowsheets 11/28/2020  Abnormal Finding Date 08/07/2020  Confirmed Diagnosis Date 08/28/2020  Diagnosis Status Confirmed Diagnosis Complete  Planned Course of Treatment Surgery  Phase of Treatment Surgery  Surgery Actual Start Date: 11/02/2020  Navigator Follow Up Date: 11/28/2020  Navigator Follow Up Reason: New Patient Appointment  Navigation Complete Date: 11/28/2020  Post Navigation: Continue to Follow Patient? No  Reason Not Navigating Patient: No Treatment, Observation Only  Navigator Location [facility]  Navigator Encounter Type Clinic/MDC;Initial MedOnc  Treatment Initiated Date 11/02/2020  Patient Visit Type Initial;MedOnc  Treatment Phase Other  Barriers/Navigation Needs No Barriers At This Time/patient had surgery on 3/4.  He required no follow med onc therapy at this time. Was instructed to call if develop any concerning symptoms.   Interventions Psycho-Social Support  Acuity Level 2-Minimal Needs (1-2 Barriers Identified)  Time Spent with Patient 30

## 2020-12-04 ENCOUNTER — Encounter: Payer: Self-pay | Admitting: *Deleted

## 2021-01-22 ENCOUNTER — Encounter: Payer: Self-pay | Admitting: Thoracic Surgery (Cardiothoracic Vascular Surgery)

## 2021-01-22 ENCOUNTER — Other Ambulatory Visit: Payer: Self-pay

## 2021-01-22 ENCOUNTER — Ambulatory Visit (INDEPENDENT_AMBULATORY_CARE_PROVIDER_SITE_OTHER): Payer: Self-pay | Admitting: Thoracic Surgery (Cardiothoracic Vascular Surgery)

## 2021-01-22 ENCOUNTER — Other Ambulatory Visit: Payer: Self-pay | Admitting: Thoracic Surgery (Cardiothoracic Vascular Surgery)

## 2021-01-22 ENCOUNTER — Ambulatory Visit
Admission: RE | Admit: 2021-01-22 | Discharge: 2021-01-22 | Disposition: A | Discharge status: Home / Self Care | Payer: Medicare HMO | Source: Ambulatory Visit | Attending: Thoracic Surgery (Cardiothoracic Vascular Surgery) | Admitting: Thoracic Surgery (Cardiothoracic Vascular Surgery)

## 2021-01-22 VITALS — BP 140/88 | HR 76 | Resp 20 | Ht 76.0 in | Wt 243.0 lb

## 2021-01-22 DIAGNOSIS — J9811 Atelectasis: Secondary | ICD-10-CM | POA: Diagnosis not present

## 2021-01-22 DIAGNOSIS — D1432 Benign neoplasm of left bronchus and lung: Secondary | ICD-10-CM

## 2021-01-22 DIAGNOSIS — C3432 Malignant neoplasm of lower lobe, left bronchus or lung: Secondary | ICD-10-CM

## 2021-01-22 DIAGNOSIS — Z09 Encounter for follow-up examination after completed treatment for conditions other than malignant neoplasm: Secondary | ICD-10-CM

## 2021-01-22 DIAGNOSIS — J984 Other disorders of lung: Secondary | ICD-10-CM | POA: Diagnosis not present

## 2021-01-22 DIAGNOSIS — J929 Pleural plaque without asbestos: Secondary | ICD-10-CM | POA: Diagnosis not present

## 2021-01-22 DIAGNOSIS — R918 Other nonspecific abnormal finding of lung field: Secondary | ICD-10-CM | POA: Diagnosis not present

## 2021-01-22 MED ORDER — PREGABALIN 25 MG PO CAPS
25.0000 mg | ORAL_CAPSULE | Freq: Two times a day (BID) | ORAL | 1 refills | Status: DC
Start: 2021-01-22 — End: 2021-01-29

## 2021-01-22 MED ORDER — TRAMADOL HCL 50 MG PO TABS: 50.0000 mg | ORAL_TABLET | Freq: Four times a day (QID) | ORAL | 0 refills | Status: DC | PRN

## 2021-01-22 NOTE — Progress Notes (Signed)
[address]       [place] [address]             [phone]     HPI: Mr. Mcmichael returns for a scheduled follow-up visit  Braxon Suder is a 70 year old man with a history of tobacco abuse, COPD, anxiety, and kidney stones.  He was found to have a groundglass opacity on a low-dose CT for lung cancer screening.  Year later that had increased in size.  Bronchoscopy showed atypical cells worrisome for adenocarcinoma.  PET/CT showed a compression fracture of the T11 vertebral body.  Biopsy of that was negative and he had kyphoplasty by IR.  I did a robotic assisted left lower lobectomy on 11/02/2020.  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  He saw Dr. Julien Nordmann.  No further follow-up is necessary for that.  He continues to have some incisional pain and also bulging in the left upper quadrant.  He is very worried about that.  He initially was taking oxycodone but at his last visit we prescribed tramadol.  That was working fine in the feels like oxycodone is overkill.  Since his tramadol ran out he has been taking ibuprofen.  Past Medical History:  Diagnosis Date  . Anxiety   . Centrilobular emphysema ([place])    Pt denies 10/31/20  . COPD (chronic obstructive pulmonary disease) ([place])    Pt denies 10/31/20  . Former smoker   . History of kidney stones     x2 passed    Current Outpatient Medications  Medication Sig Dispense Refill  . atorvastatin (LIPITOR) 20 MG tablet Take 20 mg by mouth daily.    . citalopram (CELEXA) 10 MG tablet Take 10 mg by mouth daily.    . citalopram (CELEXA) 20 MG tablet Take 20 mg by mouth daily.    Marland Kitchen docusate sodium (COLACE) 100 MG capsule Take 100 mg by mouth daily as needed for mild constipation.    Marland Kitchen ibuprofen (ADVIL,MOTRIN) 200 MG tablet Take 800 mg by mouth every 8 (eight) hours as needed for mild pain.    . pregabalin (LYRICA) 25 MG capsule Take 1 capsule (25 mg total) by mouth 2 (two) times daily. 60 capsule 1   . sildenafil (REVATIO) 20 MG tablet Take 40-80 mg by mouth daily as needed (erectile dysfunction.).    Marland Kitchen traMADol (ULTRAM) 50 MG tablet Take 1 tablet (50 mg total) by mouth every 6 (six) hours as needed. 25 tablet 0   No current facility-administered medications for this visit.    Physical Exam BP 140/88   Pulse 76   Resp 20   Ht 6\' 4"  (1.93 m)   Wt 243 lb (110.2 kg)   SpO2 97% Comment: RA  BMI 29.65 kg/m  70 year old man in no acute distress Alert and oriented x3 with no focal deficits Lungs diminished breath sounds at left base but otherwise clear Incisions well-healed Muscular atrophy left upper quadrant  Diagnostic Tests: I reviewed the chest x-ray.  Shows postoperative changes.  Impression: Tunis Gentle is a 69 year old man with a history of tobacco abuse, COPD, anxiety, and kidney stones.  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  Overall he is doing well.  He does still have some discomfort which is not terribly unusual.  His bigger concern is the bulging in the left upper quadrant.  I reassured him that that  is normal and typically resolves over time although it sometimes takes 6 months to a year to do so.  I gave him a prescription for Lyrica 25 mg p.o.twice daily.  I also gave him a prescription for tramadol 50 mg p.o. every 6 hours as needed for pain.  Hopefully he will be able to wean off the tramadol relatively soon.  Plan: Return in 2 months with PA and lateral chest x-ray  Melrose Nakayama, MD [facility] [phone]

## 2021-01-29 ENCOUNTER — Other Ambulatory Visit: Payer: Self-pay

## 2021-01-29 MED ORDER — GABAPENTIN 300 MG PO CAPS: 300.0000 mg | ORAL_CAPSULE | Freq: Two times a day (BID) | ORAL | 1 refills | Status: DC

## 2021-01-29 NOTE — Progress Notes (Signed)
Patient called made aware of medication change due to insurance coverage. Patient did not answer phone. Left message for return call.

## 2021-02-14 ENCOUNTER — Telehealth: Payer: Self-pay

## 2021-02-14 MED ORDER — TRAMADOL HCL 50 MG PO TABS: 50.0000 mg | ORAL_TABLET | Freq: Four times a day (QID) | ORAL | 0 refills | Status: AC | PRN

## 2021-02-14 NOTE — Telephone Encounter (Signed)
-----   Message from Melrose Nakayama, MD sent at 02/14/2021  2:28 PM EDT ----- Regarding: RE: Pain medication refill, Tramadol He can have another refill if he needs it  [facility] ----- Message ----- From: Donnella Sham, RN Sent: 02/14/2021  12:32 PM EDT To: Melrose Nakayama, MD Subject: Pain medication refill, Tramadol               Hey,  Patient called and stated that he had 4 pills of Tramadol left, so this is not urgent. He is currently taking them about once a day for pain at night. I saw in your last note that you wanted him off of them soon, so was not sure if you would approve another refill. Advised that he may not get another, but that I would ask. Please advise.  Thanks,  Caryl Pina

## 2021-02-14 NOTE — Telephone Encounter (Signed)
Patient called and made aware of prescription refill of Tramadol 50 mg PO Q6 hrs PRN for pain for 7 days # 28, no refills, sent into [employer] [facility].

## 2021-03-23 ENCOUNTER — Other Ambulatory Visit: Payer: Self-pay | Admitting: Thoracic Surgery (Cardiothoracic Vascular Surgery)

## 2021-04-01 ENCOUNTER — Other Ambulatory Visit: Payer: Self-pay | Admitting: Thoracic Surgery (Cardiothoracic Vascular Surgery)

## 2021-04-01 DIAGNOSIS — Z902 Acquired absence of lung [part of]: Secondary | ICD-10-CM

## 2021-04-02 ENCOUNTER — Ambulatory Visit: Payer: Medicare HMO | Admitting: Thoracic Surgery (Cardiothoracic Vascular Surgery)

## 2021-04-02 ENCOUNTER — Encounter: Payer: Self-pay | Admitting: Thoracic Surgery (Cardiothoracic Vascular Surgery)

## 2021-04-02 ENCOUNTER — Ambulatory Visit
Admission: RE | Admit: 2021-04-02 | Discharge: 2021-04-02 | Disposition: A | Discharge status: Home / Self Care | Payer: Medicare HMO | Source: Ambulatory Visit | Attending: Thoracic Surgery (Cardiothoracic Vascular Surgery) | Admitting: Thoracic Surgery (Cardiothoracic Vascular Surgery)

## 2021-04-02 ENCOUNTER — Other Ambulatory Visit: Payer: Self-pay

## 2021-04-02 VITALS — BP 123/73 | HR 69 | Resp 20 | Ht 76.0 in | Wt 231.4 lb

## 2021-04-02 DIAGNOSIS — C3432 Malignant neoplasm of lower lobe, left bronchus or lung: Secondary | ICD-10-CM | POA: Diagnosis not present

## 2021-04-02 DIAGNOSIS — Z902 Acquired absence of lung [part of]: Secondary | ICD-10-CM | POA: Diagnosis not present

## 2021-04-02 DIAGNOSIS — Z9889 Other specified postprocedural states: Secondary | ICD-10-CM | POA: Diagnosis not present

## 2021-04-02 NOTE — Progress Notes (Signed)
[address]       [place] [address]             [phone]      HPI: Mr. 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  He was found to have a lung nodule on a low-dose screening CT.  Bronchoscopy showed atypical cells.  PET/CT showed a compression fracture of T11 vertebral body.  He had that biopsied and had a kyphoplasty.  He then underwent a robotic assisted left lower lobectomy on 11/02/2020.  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  He saw Dr. Julien Nordmann in consultation.  This is a benign lesion that does not require any further follow-up.  He still has some incisional discomfort.  It is improved dramatically over time.  He now feels like there is some "nerves waking up."  He is aware of some muscular contraction in the left upper quadrant.  Still has a bulge there.  Past Medical History:  Diagnosis Date   Anxiety    Centrilobular emphysema ([place])    Pt denies 10/31/20   COPD (chronic obstructive pulmonary disease) ([place])    Pt denies 10/31/20   Former smoker    History of kidney stones     x2 passed     Current Outpatient Medications  Medication Sig Dispense Refill   atorvastatin (LIPITOR) 20 MG tablet Take 20 mg by mouth daily.     citalopram (CELEXA) 10 MG tablet Take 10 mg by mouth daily.     citalopram (CELEXA) 20 MG tablet Take 20 mg by mouth daily.     docusate sodium (COLACE) 100 MG capsule Take 100 mg by mouth daily as needed for mild constipation.     gabapentin (NEURONTIN) 300 MG capsule TAKE ONE CAPSULE BY MOUTH TWICE DAILY 60 capsule 0   ibuprofen (ADVIL,MOTRIN) 200 MG tablet Take 800 mg by mouth every 8 (eight) hours as needed for mild pain.     sildenafil (REVATIO) 20 MG tablet Take  40-80 mg by mouth daily as needed (erectile dysfunction.).     No current facility-administered medications for this visit.    Physical Exam BP 123/73 (BP Location: Right Arm, Patient Position: Sitting, Cuff Size: Normal)   Pulse 69   Resp 20   Ht 6\' 4"  (1.93 m)   Wt 231 lb 6.4 oz (105 kg)   SpO2 96%   BMI 28.3 kg/m  70 year old man in no acute distress Alert and oriented x3 with no focal deficits Lungs diminished at left base otherwise clear Cardiac regular rate and rhythm Incisions well-healed Laxity of upper abdominal musculature and left upper quadrant  Diagnostic Tests: I personally reviewed the chest x-ray shows postoperative changes.  Impression: Isaac Carrillo is a 70 year old man with a history of tobacco abuse, COPD, anxiety, and kidney stones.  He was found to have a groundglass opacity on a low-dose CT for lung cancer screening.  Biopsies showed atypical cells that were suspicious for adenocarcinoma but not diagnostic.  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  His incisional pain has improved.  He is on Lyrica 25 mg twice daily.  He still  has some tramadol from the last prescription we gave him.  He takes 1 of those every 3 to 4 days at night before he goes to bed.  He still has some left.  He is back at work.  Still has some laxity of the upper abdominal musculature but he feels like that may be starting to improve a little bit.  I did tell him that that will take a while because the muscle gets atrophied and we will have to rebuild.  Plan: Return in 3 months with PA lateral chest x-ray to check on progress  Melrose Nakayama, MD [facility] [phone]

## 2021-04-04 ENCOUNTER — Emergency Department (HOSPITAL_COMMUNITY): Payer: Medicare HMO

## 2021-04-04 ENCOUNTER — Emergency Department (HOSPITAL_COMMUNITY)
Admission: EM | Admit: 2021-04-04 | Discharge: 2021-04-04 | Disposition: A | Discharge status: Home / Self Care | Payer: Medicare HMO | Attending: Emergency Medicine | Admitting: Emergency Medicine

## 2021-04-04 ENCOUNTER — Other Ambulatory Visit: Payer: Self-pay

## 2021-04-04 DIAGNOSIS — J449 Chronic obstructive pulmonary disease, unspecified: Secondary | ICD-10-CM | POA: Diagnosis not present

## 2021-04-04 DIAGNOSIS — Z20822 Contact with and (suspected) exposure to covid-19: Secondary | ICD-10-CM | POA: Diagnosis not present

## 2021-04-04 DIAGNOSIS — J40 Bronchitis, not specified as acute or chronic: Secondary | ICD-10-CM | POA: Diagnosis not present

## 2021-04-04 DIAGNOSIS — Z87891 Personal history of nicotine dependence: Secondary | ICD-10-CM | POA: Insufficient documentation

## 2021-04-04 DIAGNOSIS — R051 Acute cough: Secondary | ICD-10-CM | POA: Diagnosis not present

## 2021-04-04 DIAGNOSIS — I491 Atrial premature depolarization: Secondary | ICD-10-CM | POA: Diagnosis not present

## 2021-04-04 DIAGNOSIS — R0789 Other chest pain: Secondary | ICD-10-CM | POA: Diagnosis not present

## 2021-04-04 DIAGNOSIS — Z743 Need for continuous supervision: Secondary | ICD-10-CM | POA: Diagnosis not present

## 2021-04-04 DIAGNOSIS — R079 Chest pain, unspecified: Secondary | ICD-10-CM | POA: Diagnosis not present

## 2021-04-04 DIAGNOSIS — R0602 Shortness of breath: Secondary | ICD-10-CM

## 2021-04-04 LAB — CBC WITH DIFFERENTIAL/PLATELET
Abs Immature Granulocytes: 0.07 10*3/uL (ref 0.00–0.07)
Basophils Absolute: 0 10*3/uL (ref 0.0–0.1)
Basophils Relative: 0 %
Eosinophils Absolute: 0.1 10*3/uL (ref 0.0–0.5)
Eosinophils Relative: 0 %
HCT: 45.3 % (ref 39.0–52.0)
Hemoglobin: 15.1 g/dL (ref 13.0–17.0)
Immature Granulocytes: 0 %
Lymphocytes Relative: 6 %
Lymphs Abs: 0.9 10*3/uL (ref 0.7–4.0)
MCH: 31.1 pg (ref 26.0–34.0)
MCHC: 33.3 g/dL (ref 30.0–36.0)
MCV: 93.4 fL (ref 80.0–100.0)
Monocytes Absolute: 0.7 10*3/uL (ref 0.1–1.0)
Monocytes Relative: 4 %
Neutro Abs: 14.2 10*3/uL — ABNORMAL HIGH (ref 1.7–7.7)
Neutrophils Relative %: 90 %
Platelets: 139 10*3/uL — ABNORMAL LOW (ref 150–400)
RBC: 4.85 MIL/uL (ref 4.22–5.81)
RDW: 14.1 % (ref 11.5–15.5)
WBC: 15.9 10*3/uL — ABNORMAL HIGH (ref 4.0–10.5)
nRBC: 0 % (ref 0.0–0.2)

## 2021-04-04 LAB — BASIC METABOLIC PANEL
Anion gap: 8 (ref 5–15)
BUN: 19 mg/dL (ref 8–23)
CO2: 24 mmol/L (ref 22–32)
Calcium: 9.3 mg/dL (ref 8.9–10.3)
Chloride: 104 mmol/L (ref 98–111)
Creatinine, Ser: 1.15 mg/dL (ref 0.61–1.24)
GFR, Estimated: >60 mL/min (ref >60)
Glucose, Bld: 99 mg/dL (ref 70–99)
Potassium: 3.9 mmol/L (ref 3.5–5.1)
Sodium: 136 mmol/L (ref 135–145)

## 2021-04-04 LAB — BRAIN NATRIURETIC PEPTIDE: B Natriuretic Peptide: 26.7 pg/mL (ref 0.0–100.0)

## 2021-04-04 LAB — RESP PANEL BY RT-PCR (FLU A&B, COVID) ARPGX2
Influenza A by PCR: NEGATIVE
Influenza B by PCR: NEGATIVE
SARS Coronavirus 2 by RT PCR: NEGATIVE

## 2021-04-04 MED ORDER — DOXYCYCLINE HYCLATE 100 MG PO TABS
100.0000 mg | ORAL_TABLET | Freq: Once | ORAL | Status: AC
  Administered 2021-04-04: 100 mg via ORAL
  Filled 2021-04-04: qty 1

## 2021-04-04 MED ORDER — ALBUTEROL SULFATE HFA 108 (90 BASE) MCG/ACT IN AERS
2.0000 | INHALATION_SPRAY | Freq: Once | RESPIRATORY_TRACT | Status: AC
  Administered 2021-04-04: 2 via RESPIRATORY_TRACT
  Filled 2021-04-04: qty 6.7

## 2021-04-04 MED ORDER — PREDNISONE 20 MG PO TABS
60.0000 mg | ORAL_TABLET | Freq: Once | ORAL | Status: AC
  Administered 2021-04-04: 60 mg via ORAL
  Filled 2021-04-04: qty 3

## 2021-04-04 MED ORDER — DOXYCYCLINE HYCLATE 100 MG PO CAPS
100.0000 mg | ORAL_CAPSULE | Freq: Two times a day (BID) | ORAL | 0 refills | Status: DC
Start: 2021-04-04 — End: 2023-07-07

## 2021-04-04 MED ORDER — PREDNISONE 10 MG PO TABS: 50.0000 mg | ORAL_TABLET | Freq: Every day | ORAL | 0 refills | Status: DC

## 2021-04-04 NOTE — ED Provider Notes (Signed)
[facility] EMERGENCY DEPARTMENT Provider Note   CSN: [id number] Arrival date & time: 04/04/21  1722     History Chief Complaint  Patient presents with   Shortness of Breath   Chest Pain    Trevell Carrillo is a 70 y.o. male.  HPI     69 year old male with history of emphysema/COPD comes in with chief complaint of chest pain and shortness of breath.  Patient reports that he has been feeling slightly sluggish over the last 2 or 3 days.  He slept for 20 hours 2 days ago.  Today he was at work, had sudden onset of chest pain with shortness of breath.  He he went to his PCP, who advised that he come to the ER.  Patient had chest pain that has now resolved.  Chest pain when present was described as tightness type feeling that was worse when he took a deep breath.  He also had shortness of breath, cough.  Cough is nonproductive.  Patient has history of lobectomy.  He does not smoke anymore.  He states that he does not have any underlying heart or lung disease.  He is vaccinated against COVID-19, denies any sick exposure.  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/2020   Hypertriglyceridemia 09/19/2020   Infection of left eye 09/19/2020   Mixed anxiety and depressive disorder 09/19/2020   Obesity 09/19/2020   Tobacco abuse 09/19/2020   Nodule of lower lobe of left lung 08/15/2020    Past Surgical History:  Procedure Laterality Date   BRONCHIAL BIOPSY  08/28/2020    Procedure: BRONCHIAL BIOPSIES;  Surgeon: Garner Nash, DO;  Location: [facility];  Service: Pulmonary;;   BRONCHIAL BRUSHINGS  08/28/2020   Procedure: BRONCHIAL BRUSHINGS;  Surgeon: Garner Nash, DO;  Location: [facility];  Service: Pulmonary;;   BRONCHIAL NEEDLE ASPIRATION BIOPSY  08/28/2020   Procedure: BRONCHIAL NEEDLE ASPIRATION BIOPSIES;  Surgeon: Garner Nash, DO;  Location: [facility];  Service: Pulmonary;;   BRONCHIAL WASHINGS  08/28/2020   Procedure: BRONCHIAL WASHINGS;  Surgeon: Garner Nash, DO;  Location: [facility];  Service: Pulmonary;;   DENTAL SURGERY     ENDOBRONCHIAL ULTRASOUND  08/28/2020   Procedure: ENDOBRONCHIAL ULTRASOUND;  Surgeon: Garner Nash, DO;  Location: [facility];  Service: Pulmonary;;   INTERCOSTAL NERVE BLOCK Left 11/02/2020   Procedure: INTERCOSTAL NERVE BLOCK;  Surgeon: Melrose Nakayama, MD;  Location: [facility];  Service: Thoracic;  Laterality: Left;   IR BONE TUMOR(S)RF ABLATION  10/15/2020   IR KYPHO THORACIC WITH BONE BIOPSY  10/15/2020   IR RADIOLOGIST EVAL & MGMT  10/04/2020   LYMPH NODE DISSECTION Left 11/02/2020   Procedure: LYMPH NODE DISSECTION;  Surgeon: Melrose Nakayama, MD;  Location: [facility];  Service: Thoracic;  Laterality: Left;   VIDEO BRONCHOSCOPY WITH ENDOBRONCHIAL NAVIGATION Left 08/28/2020   Procedure: VIDEO BRONCHOSCOPY WITH ENDOBRONCHIAL NAVIGATION;  Surgeon: Garner Nash,  DO;  Location: [facility];  Service: Pulmonary;  Laterality: Left;       Family History  Problem Relation Age of Onset   Heart disease Mother    Heart disease Father     Social History   Tobacco Use   Smoking status: Former    Packs/day: 2.50    Years: 40.00    Pack years: 100.00    Types: Cigarettes    Quit date: 03/01/2016    Years since quitting: 5.0   Smokeless tobacco: Never   Tobacco comments:    Quit smoking 03/2016  Vaping Use   Vaping Use: Never used  Substance Use Topics   Alcohol use: No   Drug use: Never     Home Medications Prior to Admission medications   Medication Sig Start Date End Date Taking? Authorizing Provider  doxycycline (VIBRAMYCIN) 100 MG capsule Take 1 capsule (100 mg total) by mouth 2 (two) times daily. 04/04/21  Yes Varney Biles, MD  predniSONE (DELTASONE) 10 MG tablet Take 5 tablets (50 mg total) by mouth daily. 04/04/21  Yes Roxana Lai, MD  atorvastatin (LIPITOR) 20 MG tablet Take 20 mg by mouth daily. 08/10/20   [provider]  citalopram (CELEXA) 10 MG tablet Take 10 mg by mouth daily. 05/12/20   [provider]  citalopram (CELEXA) 20 MG tablet Take 20 mg by mouth daily. 07/30/20   [provider]  docusate sodium (COLACE) 100 MG capsule Take 100 mg by mouth daily as needed for mild constipation.    [provider]  gabapentin (NEURONTIN) 300 MG capsule TAKE ONE CAPSULE BY MOUTH TWICE DAILY 03/25/21   Melrose Nakayama, MD  ibuprofen (ADVIL,MOTRIN) 200 MG tablet Take 800 mg by mouth every 8 (eight) hours as needed for mild pain.    [provider]  sildenafil (REVATIO) 20 MG tablet Take 40-80 mg by mouth daily as needed (erectile dysfunction.).    [provider]    Allergies    Patient has no known allergies.  Review of Systems   Review of Systems  Constitutional:  Positive for activity change.  Respiratory:  Positive for cough and shortness of breath.   Cardiovascular:  Positive for chest pain.  Skin:  Negative for rash.  Allergic/Immunologic: Negative for immunocompromised state.  Hematological:  Does not bruise/bleed easily.  All other systems reviewed and are negative.  Physical Exam Updated Vital Signs BP (!) 101/53   Pulse 94   Temp 99.6 F (37.6 C) (Oral)   Resp (!) 24   SpO2 92%   Physical Exam Vitals and nursing note reviewed.  Constitutional:      Appearance: He is well-developed.  HENT:     Head: Atraumatic.  Cardiovascular:     Rate and Rhythm: Normal rate.  Pulmonary:      Effort: Pulmonary effort is normal.     Breath sounds: No decreased breath sounds, wheezing, rhonchi or rales.  Musculoskeletal:     Cervical back: Neck supple.     Right lower leg: No tenderness. No edema.     Left lower leg: No tenderness. No edema.  Skin:    General: Skin is warm.  Neurological:     Mental Status: He is alert and oriented to person, place, and time.    ED Results / Procedures / Treatments   Labs (all labs ordered are listed, but only abnormal results are displayed) Labs Reviewed  CBC WITH DIFFERENTIAL/PLATELET - Abnormal; Notable for the following components:  Result Value   WBC 15.9 (*)    Platelets 139 (*)    Neutro Abs 14.2 (*)    All other components within normal limits  RESP PANEL BY RT-PCR (FLU A&B, COVID) ARPGX2  BASIC METABOLIC PANEL  BRAIN NATRIURETIC PEPTIDE    EKG EKG Interpretation  Date/Time:  Thursday April 04 2021 17:42:19 EDT Ventricular Rate:  90 PR Interval:    QRS Duration: 108 QT Interval:  371 QTC Calculation: 454 R Axis:   97 Text Interpretation: Normal sinus rhythm Left posterior fascicular block No old tracing to compare Confirmed by Varney Biles [id number]) on 04/04/2021 5:56:10 PM  Radiology DG Chest Port 1 View  Result Date: 04/04/2021 CLINICAL DATA:  Shortness of breath EXAM: PORTABLE CHEST 1 VIEW COMPARISON:  04/02/2021, CT 08/06/2020, chest x-ray 01/22/2021 FINDINGS: Status post left lower lobectomy. Probable scarring at the left base. No pleural effusion or pneumothorax. Stable cardiomediastinal silhouette. IMPRESSION: No active disease.  Postsurgical changes on the left Electronically Signed   By: Donavan Foil M.D.   On: 04/04/2021 18:31    Procedures Procedures   Medications Ordered in ED Medications  albuterol (VENTOLIN HFA) 108 (90 Base) MCG/ACT inhaler 2 puff (2 puffs Inhalation Given 04/04/21 1815)  predniSONE (DELTASONE) tablet 60 mg (60 mg Oral Given 04/04/21 2124)  doxycycline (VIBRA-TABS) tablet 100 mg (100  mg Oral Given 04/04/21 2124)    ED Course  I have reviewed the triage vital signs and the nursing notes.  Pertinent labs & imaging results that were available during my care of the patient were reviewed by me and considered in my medical decision making (see chart for details).    MDM Rules/Calculators/A&P                            70 year old with history of COPD-emphysema and right-sided lobectomy comes in with chief complaint of chest tightness, shortness of breath.  It appears that he is also been feeling generally unwell the last 2 or 3 days.  On exam there is no wheezing, no overt sign of volume overload.  Patient is coughing, there is concern for this being cardiac to be secondary to CHF.  X-rays ordered.  No evidence of pitting edema, JVD.  The other possibility includes COPD exacerbation, pneumonia, COVID-19.  Finally with pleuritic component to the discomfort, PE is also in the differential diagnosis.  I did turn off the oxygen, and patient's O2 sats is over 92% still.  He is noted to be slightly tachypneic.  We will give him breathing treatments and reassess.   .REASSESSMENT  Patient feels a lot better after the breathing treatment.  No wheezing still.  Work-up is reassuring.  He reports that he does not have COPD or emphysema, that was only radiologic evidence of it.  He has had negative functional testing.  Patient denies any PE risk factors and reports that the current symptoms really just started within the last 2 or 3 days.  He doubts that he has any blood clots.  He is having cough and appreciated of the inhalers that we gave him here.  We will treat him like bronchitis and also give him doxycycline along with prednisone.  He will return to the ER if his symptoms are progressing at which time CT scan for PE rule out might need to be considered. HYPOXIA HAS RESOLVED.   Final Clinical Impression(s) / ED Diagnoses Final diagnoses:  Bronchitis  Shortness of breath  Rx  / DC Orders ED Discharge Orders          Ordered    predniSONE (DELTASONE) 10 MG tablet  Daily        04/04/21 2127    doxycycline (VIBRAMYCIN) 100 MG capsule  2 times daily        04/04/21 2127             Varney Biles, MD 04/05/21 [phone]

## 2021-04-04 NOTE — ED Notes (Signed)
Pt had sudden onset CP and shortness of breath about 1500. Was at PCP and they sent him in. Pt was at work when it happened. CP has subsided, but still has it with breathing and coughing. Wet sounding cough. Still feeling SOA. Was 84% on RA for Ems at providers office. Pt is working to breath, but no retractions or tripoding.

## 2021-04-04 NOTE — ED Triage Notes (Signed)
Pt from PCP office via EMS for eval of cp and shob that began at 1445 today. H x of lung CA and lobectomy in 10/2020. 1 nitro and 324 asa given at PCP office. Chest pain present only with cough and deep breath. SpO2 84% room air at office.

## 2021-04-04 NOTE — ED Notes (Signed)
RT to come do peak flow on pt.

## 2021-04-04 NOTE — Progress Notes (Signed)
Peak flow done with patient best out of 2 attempts with ~230.

## 2021-04-04 NOTE — Discharge Instructions (Addendum)
All the results in the ER are normal, labs and imaging. We are not sure what is causing your symptoms. We suspect that you might have bronchitis.  Use the inhaler every 2-4 hours and take the medications prescribed.  The workup in the ER is not complete, and is limited to screening for life threatening and emergent conditions only, so please see a primary care doctor for further evaluation.  Return to the ER if your symptoms get worse.

## 2021-04-04 NOTE — ED Notes (Signed)
Xray here.

## 2021-04-04 NOTE — ED Notes (Signed)
Pt resting in bed. Pt readjusted in bed and bed laid back. Pt was very short of breath while moving in the bed.

## 2021-04-04 NOTE — ED Notes (Signed)
Pt ambulatory at d/c. VSS. GCS 15. Pt got cab back.

## 2021-04-26 ENCOUNTER — Other Ambulatory Visit: Payer: Self-pay | Admitting: Thoracic Surgery (Cardiothoracic Vascular Surgery)

## 2021-06-03 DIAGNOSIS — Z87891 Personal history of nicotine dependence: Secondary | ICD-10-CM | POA: Diagnosis not present

## 2021-06-03 DIAGNOSIS — N529 Male erectile dysfunction, unspecified: Secondary | ICD-10-CM | POA: Diagnosis not present

## 2021-06-03 DIAGNOSIS — Z85118 Personal history of other malignant neoplasm of bronchus and lung: Secondary | ICD-10-CM | POA: Diagnosis not present

## 2021-06-03 DIAGNOSIS — E785 Hyperlipidemia, unspecified: Secondary | ICD-10-CM | POA: Diagnosis not present

## 2021-06-03 DIAGNOSIS — M792 Neuralgia and neuritis, unspecified: Secondary | ICD-10-CM | POA: Diagnosis not present

## 2021-06-03 DIAGNOSIS — R69 Illness, unspecified: Secondary | ICD-10-CM | POA: Diagnosis not present

## 2021-06-03 DIAGNOSIS — Z8249 Family history of ischemic heart disease and other diseases of the circulatory system: Secondary | ICD-10-CM | POA: Diagnosis not present

## 2021-06-03 DIAGNOSIS — G8929 Other chronic pain: Secondary | ICD-10-CM | POA: Diagnosis not present

## 2021-06-03 DIAGNOSIS — F419 Anxiety disorder, unspecified: Secondary | ICD-10-CM | POA: Diagnosis not present

## 2021-06-03 DIAGNOSIS — R030 Elevated blood-pressure reading, without diagnosis of hypertension: Secondary | ICD-10-CM | POA: Diagnosis not present

## 2021-07-01 ENCOUNTER — Other Ambulatory Visit: Payer: Self-pay | Admitting: Thoracic Surgery (Cardiothoracic Vascular Surgery)

## 2021-07-01 DIAGNOSIS — D1432 Benign neoplasm of left bronchus and lung: Secondary | ICD-10-CM

## 2021-07-02 ENCOUNTER — Ambulatory Visit: Payer: Medicare HMO | Admitting: Thoracic Surgery (Cardiothoracic Vascular Surgery)

## 2021-07-02 ENCOUNTER — Encounter: Payer: Self-pay | Admitting: Thoracic Surgery (Cardiothoracic Vascular Surgery)

## 2021-07-02 ENCOUNTER — Ambulatory Visit
Admission: RE | Admit: 2021-07-02 | Discharge: 2021-07-02 | Disposition: A | Discharge status: Home / Self Care | Payer: Medicare HMO | Source: Ambulatory Visit | Attending: Thoracic Surgery (Cardiothoracic Vascular Surgery) | Admitting: Thoracic Surgery (Cardiothoracic Vascular Surgery)

## 2021-07-02 ENCOUNTER — Other Ambulatory Visit: Payer: Self-pay

## 2021-07-02 VITALS — BP 127/84 | HR 83 | Resp 20 | Ht 76.0 in | Wt 225.0 lb

## 2021-07-02 DIAGNOSIS — D1432 Benign neoplasm of left bronchus and lung: Secondary | ICD-10-CM

## 2021-07-02 DIAGNOSIS — Z902 Acquired absence of lung [part of]: Secondary | ICD-10-CM

## 2021-07-02 DIAGNOSIS — Z9889 Other specified postprocedural states: Secondary | ICD-10-CM | POA: Diagnosis not present

## 2021-07-02 NOTE — Progress Notes (Signed)
[address]       [place] [address]             [phone]      HPI: Isaac Carrillo returns for scheduled follow-up visit  Isaac Carrillo is a 70 year old man with history of tobacco abuse, COPD, anxiety, and kidney stones.  He was found to have a groundglass opacity in the low-dose CT for lung cancer screening.  He underwent a robotic left lower lobectomy in March 2022.  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  I last saw him in August.  He was still complaining of some mild laxity in the abdominal musculature and some unusual sensations in the left upper quadrant area.  He continues to have muscular laxity.  He has noticed that he does sweat on that side again.  That had disappeared for a while.  He does still have some pain over there.  He is on gabapentin but does not feel like it is doing much for him.  Past Medical History:  Diagnosis Date   Anxiety    Centrilobular emphysema ([place])    Pt denies 10/31/20   COPD (chronic obstructive pulmonary disease) ([place])    Pt denies 10/31/20   Former smoker    History of kidney stones     x2 passed    Current Outpatient Medications  Medication Sig Dispense Refill   atorvastatin (LIPITOR) 20 MG tablet Take 20 mg by mouth daily.     citalopram (CELEXA) 20 MG tablet Take 20 mg by mouth daily.     gabapentin (NEURONTIN) 300 MG capsule TAKE ONE CAPSULE BY MOUTH TWICE DAILY 60 capsule 0   ibuprofen (ADVIL,MOTRIN) 200 MG tablet Take 800 mg by mouth every 8 (eight) hours as needed for mild pain.     sildenafil (REVATIO) 20 MG tablet Take 40-80 mg by mouth daily as needed (erectile dysfunction.).     citalopram (CELEXA) 10 MG tablet Take 10 mg by mouth daily. (Patient not taking: Reported on 07/02/2021)     docusate sodium (COLACE) 100 MG capsule Take 100 mg by mouth daily as needed for mild constipation. (Patient not taking: Reported on 07/02/2021)     doxycycline (VIBRAMYCIN) 100 MG  capsule Take 1 capsule (100 mg total) by mouth 2 (two) times daily. (Patient not taking: Reported on 07/02/2021) 14 capsule 0   predniSONE (DELTASONE) 10 MG tablet Take 5 tablets (50 mg total) by mouth daily. (Patient not taking: Reported on 07/02/2021) 20 tablet 0   No current facility-administered medications for this visit.    Physical Exam BP 127/84 (BP Location: Right Arm, Patient Position: Sitting)   Pulse 83   Resp 20   Ht 6\' 4"  (1.93 m)   Wt 225 lb (102.1 kg)   SpO2 93% Comment: RA  BMI 27.43 kg/m  70 year old man in no acute distress Alert and oriented x3 with no focal deficits Lungs diminished at left base but otherwise clear Incisions well-healed Laxity of left upper quadrant muscles  Diagnostic Tests: CHEST - 2 VIEW   COMPARISON:  04/02/2021   FINDINGS: The heart size and mediastinal contours are within normal limits. Stable postop changes in the left lower lung from previous left lower lobectomy. Both lungs are clear. No evidence of pleural effusion. Previous vertebroplasty again noted in the lower thoracic spine.   IMPRESSION: Postop changes in left hemithorax. No active cardiopulmonary disease.     Electronically Signed  By: Marlaine Hind M.D.   On: 07/02/2021 09:25 I personally reviewed the chest x-ray images.  Status post lobectomy.  Impression: Isaac Carrillo is a 70 year old gentleman with history of tobacco abuse, COPD, and anxiety.  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  Initially he was not sweating in that area either but he has noted that that has returned.  Ultimately the muscular laxity should improve as well.  He does not feel like gabapentin is doing much for him.  I recommend he decrease that to once daily for 7 days.  If he does not notice any significant  change she can stop the medication altogether.  Plan: I plan to see him back again in about 6 months.  I will do a PA and lateral chest x-ray. He will need to get back into the low-dose screening program.  Melrose Nakayama, MD [facility] [phone]

## 2021-07-02 NOTE — Patient Instructions (Signed)
Decrease gabapentin to once daily then if no worsening pain after 7 days, stop medication.

## 2021-07-10 ENCOUNTER — Other Ambulatory Visit: Payer: Self-pay | Admitting: *Deleted

## 2021-07-10 DIAGNOSIS — Z87891 Personal history of nicotine dependence: Secondary | ICD-10-CM

## 2021-12-22 DIAGNOSIS — Z791 Long term (current) use of non-steroidal anti-inflammatories (NSAID): Secondary | ICD-10-CM | POA: Diagnosis not present

## 2021-12-22 DIAGNOSIS — Z85118 Personal history of other malignant neoplasm of bronchus and lung: Secondary | ICD-10-CM | POA: Diagnosis not present

## 2021-12-22 DIAGNOSIS — R030 Elevated blood-pressure reading, without diagnosis of hypertension: Secondary | ICD-10-CM | POA: Diagnosis not present

## 2021-12-22 DIAGNOSIS — R69 Illness, unspecified: Secondary | ICD-10-CM | POA: Diagnosis not present

## 2021-12-22 DIAGNOSIS — K08109 Complete loss of teeth, unspecified cause, unspecified class: Secondary | ICD-10-CM | POA: Diagnosis not present

## 2021-12-22 DIAGNOSIS — N529 Male erectile dysfunction, unspecified: Secondary | ICD-10-CM | POA: Diagnosis not present

## 2021-12-22 DIAGNOSIS — Z87891 Personal history of nicotine dependence: Secondary | ICD-10-CM | POA: Diagnosis not present

## 2021-12-22 DIAGNOSIS — Z8249 Family history of ischemic heart disease and other diseases of the circulatory system: Secondary | ICD-10-CM | POA: Diagnosis not present

## 2021-12-22 DIAGNOSIS — E785 Hyperlipidemia, unspecified: Secondary | ICD-10-CM | POA: Diagnosis not present

## 2021-12-30 ENCOUNTER — Other Ambulatory Visit: Payer: Self-pay | Admitting: Thoracic Surgery (Cardiothoracic Vascular Surgery)

## 2021-12-30 DIAGNOSIS — D1432 Benign neoplasm of left bronchus and lung: Secondary | ICD-10-CM

## 2021-12-31 ENCOUNTER — Encounter: Payer: Self-pay | Admitting: Thoracic Surgery (Cardiothoracic Vascular Surgery)

## 2021-12-31 ENCOUNTER — Ambulatory Visit
Admission: RE | Admit: 2021-12-31 | Discharge: 2021-12-31 | Disposition: A | Discharge status: Home / Self Care | Payer: Medicare HMO | Source: Ambulatory Visit | Attending: Thoracic Surgery (Cardiothoracic Vascular Surgery) | Admitting: Thoracic Surgery (Cardiothoracic Vascular Surgery)

## 2021-12-31 ENCOUNTER — Ambulatory Visit: Payer: Medicare HMO | Admitting: Thoracic Surgery (Cardiothoracic Vascular Surgery)

## 2021-12-31 VITALS — BP 142/82 | HR 72 | Resp 20 | Ht 76.0 in | Wt 218.0 lb

## 2021-12-31 DIAGNOSIS — Z9889 Other specified postprocedural states: Secondary | ICD-10-CM | POA: Diagnosis not present

## 2021-12-31 DIAGNOSIS — C3432 Malignant neoplasm of lower lobe, left bronchus or lung: Secondary | ICD-10-CM

## 2021-12-31 DIAGNOSIS — D1432 Benign neoplasm of left bronchus and lung: Secondary | ICD-10-CM

## 2021-12-31 DIAGNOSIS — Z902 Acquired absence of lung [part of]: Secondary | ICD-10-CM

## 2021-12-31 MED ORDER — PREGABALIN 50 MG PO CAPS: 50.0000 mg | ORAL_CAPSULE | Freq: Two times a day (BID) | ORAL | 5 refills | Status: DC

## 2021-12-31 NOTE — Progress Notes (Signed)
? ?   ?[address] ?      [place] [address] ?            [phone]   ? ?HPI: Mr. 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 ? 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  He had a robotic left lower lobectomy in March 2022 for what was suspected to be an adenocarcinoma based on bronchoscopic findings.  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 ? ?Postoperatively did well except for having some intercostal neuralgia.  He tried gabapentin but that was not effective.  He continues to have some issues with that with some odd sensations in the left costal margin region and laxity in the upper abdominal musculature.  He is not having any respiratory issues. ? ?Past Medical History:  ?Diagnosis Date  ? Anxiety   ? Centrilobular emphysema ([facility])   ? Pt denies 10/31/20  ? COPD (chronic obstructive pulmonary disease) ([facility])   ? Pt denies 10/31/20  ? Former smoker   ? History of kidney stones   ?  x2 passed  ? ? ?Current Outpatient Medications  ?Medication Sig Dispense Refill  ? atorvastatin (LIPITOR) 20 MG tablet Take 20 mg by mouth daily.    ? citalopram (CELEXA) 20 MG tablet Take 20 mg by mouth daily.    ? ibuprofen (ADVIL,MOTRIN) 200 MG tablet Take 800 mg by mouth every 8 (eight) hours as needed for mild pain.    ? pregabalin (LYRICA) 50 MG capsule Take 1 capsule (50 mg total) by mouth 2 (two) times daily. 60 capsule 5  ? sildenafil (REVATIO) 20 MG tablet Take 40-80 mg by mouth daily as needed (erectile dysfunction.).    ? citalopram (CELEXA) 10 MG tablet Take 10 mg by mouth daily. (Patient not taking: Reported on 07/02/2021)    ? docusate sodium (COLACE) 100 MG capsule Take 100 mg by mouth daily as needed for mild constipation. (Patient not taking: Reported on 07/02/2021)    ? doxycycline  (VIBRAMYCIN) 100 MG capsule Take 1 capsule (100 mg total) by mouth 2 (two) times daily. (Patient not taking: Reported on 07/02/2021) 14 capsule 0  ? predniSONE (DELTASONE) 10 MG tablet Take 5 tablets (50 mg total) by mouth daily. (Patient not taking: Reported on 07/02/2021) 20 tablet 0  ? ?No current facility-administered medications for this visit.  ? ? ?Physical Exam ?BP (!) 142/82   Pulse 72   Resp 20   Ht 6\' 4"  (1.93 m)   Wt 218 lb (98.9 kg)   SpO2 95% Comment: RA  BMI 26.20 kg/m?  ?71 year old male in no acute distress ?Alert and oriented x3 ?Lungs clear with equal breath sounds bilaterally ?Cardiac regular rate and rhythm ?Incisions well-healed ?Muscular laxity left upper quadrant ? ?Diagnostic Tests: ?CHEST - 2 VIEW ?  ?COMPARISON:  07/02/2021 ?  ?FINDINGS: ?The heart size and mediastinal contours are within normal limits. ?Stable postop changes from prior left lower lobectomy. Both lungs ?are clear. Prior vertebroplasty again noted in the lower thoracic ?spine. ?  ?IMPRESSION: ?Stable postop changes in left hemithorax. No active cardiopulmonary ?disease. ?  ?  ?Electronically Signed ?  By: Marlaine Hind M.D. ?  On: 12/31/2021 09:29 ?I personally reviewed the CT images.  No suspicious findings. ? 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  ? ?He had a left lower lobe lung nodule found on a low-dose screening CT for lung cancer due to his smoking history.  He underwent bronchoscopy and aspirations were suspicious for adenocarcinoma.  He underwent a left lower lobectomy and it turned out to be a benign CNMP tumor. ? ?From a respiratory standpoint he is doing fine. ? ?Fortunately continues to have some intercostal neuralgia symptoms with unusual pains and muscular laxity.  He stopped gabapentin because he did not feel like it was doing anything for him.  We discussed trying Lyrica.  It  may not be any more effective particularly starting this late, but there is little downside to giving it a shot. ? ?He does need to have a low-dose CT screening for lung cancer as he qualifies for based on his smoking history.  We discussed that and we will plan to do a CT on him when he comes back in 6 months. ? ?Plan: ?Lyrica 50 mg p.o. twice daily, 60 tablets, 5 refills ?Return in 6 months to check on progress ?We will do a low-dose CT for lung cancer screening at that visit. ? ?Melrose Nakayama, MD ?[facility] ?[phone] ? ? ? ?

## 2022-05-29 ENCOUNTER — Other Ambulatory Visit: Payer: Self-pay | Admitting: Thoracic Surgery (Cardiothoracic Vascular Surgery)

## 2022-05-29 DIAGNOSIS — C3432 Malignant neoplasm of lower lobe, left bronchus or lung: Secondary | ICD-10-CM

## 2022-06-26 ENCOUNTER — Ambulatory Visit
Admission: RE | Admit: 2022-06-26 | Discharge: 2022-06-26 | Disposition: A | Discharge status: Home / Self Care | Payer: Medicare HMO | Source: Ambulatory Visit | Attending: Thoracic Surgery (Cardiothoracic Vascular Surgery) | Admitting: Thoracic Surgery (Cardiothoracic Vascular Surgery)

## 2022-06-26 DIAGNOSIS — Z87891 Personal history of nicotine dependence: Secondary | ICD-10-CM | POA: Diagnosis not present

## 2022-06-26 DIAGNOSIS — I2510 Atherosclerotic heart disease of native coronary artery without angina pectoris: Secondary | ICD-10-CM | POA: Diagnosis not present

## 2022-06-26 DIAGNOSIS — J432 Centrilobular emphysema: Secondary | ICD-10-CM | POA: Diagnosis not present

## 2022-06-26 DIAGNOSIS — C3432 Malignant neoplasm of lower lobe, left bronchus or lung: Secondary | ICD-10-CM

## 2022-06-26 DIAGNOSIS — M954 Acquired deformity of chest and rib: Secondary | ICD-10-CM | POA: Diagnosis not present

## 2022-06-30 ENCOUNTER — Other Ambulatory Visit: Payer: Self-pay

## 2022-06-30 DIAGNOSIS — Z122 Encounter for screening for malignant neoplasm of respiratory organs: Secondary | ICD-10-CM

## 2022-06-30 DIAGNOSIS — Z87891 Personal history of nicotine dependence: Secondary | ICD-10-CM

## 2022-07-08 ENCOUNTER — Encounter: Payer: Self-pay | Admitting: Thoracic Surgery (Cardiothoracic Vascular Surgery)

## 2022-07-08 ENCOUNTER — Ambulatory Visit: Payer: Medicare HMO | Admitting: Thoracic Surgery (Cardiothoracic Vascular Surgery)

## 2022-07-08 VITALS — BP 126/73 | HR 80 | Resp 20 | Ht 76.0 in | Wt 229.0 lb

## 2022-07-08 DIAGNOSIS — C3432 Malignant neoplasm of lower lobe, left bronchus or lung: Secondary | ICD-10-CM

## 2022-07-08 MED ORDER — PREGABALIN 50 MG PO CAPS: 50.0000 mg | ORAL_CAPSULE | Freq: Every day | ORAL | 5 refills | Status: DC

## 2022-07-08 NOTE — Progress Notes (Signed)
[address]       [place] [address]             [phone]      HPI: Mr. Duffus returns for follow-up and review of his low-dose screening CT.  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  I did a robotic left lower lobectomy in March 2022 for a left lower lobe lung mass.  It was suspected to be an adenocarcinoma based on bronchoscopic findings.  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  He had a lot of issues with intercostal neuralgia postoperatively.  We tried gabapentin and it was not effective.  He then tried Lyrica and it helped a lot initially but the longer he was on it the last was helping him.  He says that when he would only take it as needed it was a lot more effective than when he took it all the time.  He has not had any respiratory issues.  Pain is essentially unchanged from his last visit 6 months ago.  He is anxious about his CT results.  Past Medical History:  Diagnosis Date   Anxiety    Centrilobular emphysema ([facility])    Pt denies 10/31/20   COPD (chronic obstructive pulmonary disease) ([facility])    Pt denies 10/31/20   Former smoker    History of kidney stones     x2 passed    Current Outpatient Medications  Medication Sig Dispense Refill   atorvastatin (LIPITOR) 20 MG tablet Take 20 mg by mouth daily.     citalopram (CELEXA) 10 MG tablet Take 10 mg by mouth daily.     citalopram (CELEXA) 20 MG tablet Take 20 mg by mouth daily.     docusate sodium (COLACE) 100 MG capsule Take 100 mg by mouth daily as needed for mild constipation.     doxycycline (VIBRAMYCIN) 100 MG capsule Take 1 capsule (100 mg total) by mouth 2 (two) times daily. 14 capsule 0   ibuprofen (ADVIL,MOTRIN) 200 MG tablet Take 800 mg by mouth every 8 (eight) hours as needed for mild pain.     sildenafil (REVATIO) 20 MG tablet Take 40-80 mg by mouth daily as  needed (erectile dysfunction.).     pregabalin (LYRICA) 50 MG capsule Take 1 capsule (50 mg total) by mouth daily. 30 capsule 5   No current facility-administered medications for this visit.    Physical Exam BP 126/73   Pulse 80   Resp 20   Ht 6\' 4"  (1.93 m)   Wt 229 lb (103.9 kg)   SpO2 96% Comment: RA  BMI 27.24 kg/m  71 year old man in no acute distress Alert and oriented x3 with no focal deficits Lungs diminished at left base otherwise clear Incisions well-healed Cardiac regular rate and rhythm  Diagnostic Tests: CT CHEST WITHOUT CONTRAST LOW-DOSE FOR LUNG CANCER SCREENING   TECHNIQUE: Multidetector CT imaging of the chest was performed following the standard protocol without IV contrast.   RADIATION DOSE REDUCTION: This exam was performed according to the departmental dose-optimization program which includes automated exposure control, adjustment of the mA and/or kV according to patient size and/or use of iterative reconstruction technique.   COMPARISON:  08/06/2020   FINDINGS: Cardiovascular: Heart size is normal. No pericardial effusion. Aortic atherosclerosis and coronary artery calcifications.   Mediastinum/Nodes: No enlarged mediastinal, hilar,  or axillary lymph nodes. Thyroid gland, trachea, and esophagus demonstrate no significant findings.   Lungs/Pleura: Status post partial left lower lobectomy. No pleural effusion, airspace consolidation, atelectasis or pneumothorax. Paraseptal and centrilobular emphysema identified. There are 2 lung nodules identified on today's study. This includes a stable right apical nodule with a mean derived diameter of 6.9 mm. Subpleural nodule within the posterior right upper lobe is also stable with a mean derived diameter of 6.2 mm. No new lung nodules.   Upper Abdomen: No acute abnormality.   Musculoskeletal: Status post kyphoplasty for T11 compression deformity. This is new since the previous exam. Mild  superior endplate deformity involving T10 is new compared with the previous exam. No acute or suspicious osseous bone lesions.   IMPRESSION: 1. Lung-RADS 2, benign appearance or behavior. Continue annual screening with low-dose chest CT without contrast in 12 months. 2. Coronary artery calcifications 3. Aortic Atherosclerosis (ICD10-I70.0) and Emphysema (ICD10-J43.9).     Electronically Signed   By: Kerby Moors M.D.   On: 06/28/2022 10:34 I personally reviewed the CT images.  Stable right upper lobe nodules.  Mild emphysema changes.  Aortic and coronary atherosclerosis stable from 2021.  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    He had a left lower lobe lung nodule that appeared to be an adenocarcinoma based on bronchoscopic findings.  He underwent a robotic assisted left lower lobectomy and it turned out to be a ciliated mucoid nodular papillary tumor.  That is a benign tumor.  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  Will represcribe Lyrica 50 mg p.o. daily.  He plans to use that on a as needed basis.  Based on smoking history qualifies for low-dose lung cancer screening.  Plan to do another scan in a year.  Very concerned about report of coronary atherosclerosis on CT.  He is asymptomatic.  Coronary calcification essentially unchanged from 2 years ago.  In the absence of symptoms no clinical need for further investigation at present but if he desires we can arrange for a CT for coronary calcium scoring.  Plan: Lyrica 50 mg p.o. daily.  He will use as needed. He can call for refill if needed. Return in 1 year with low-dose CT for lung cancer screening  Melrose Nakayama, MD [facility] [phone]

## 2022-10-01 IMAGING — CT CT CHEST LUNG CANCER SCREENING LOW DOSE W/O CM
1 series · 10 of 10 positions shown, 13 images · non-contrast
Comparison: Low-dose lung cancer screening chest CT 08/04/2019.

CLINICAL DATA: 69-year-old male former smoker (quit in 4433) with
46 pack-year history of smoking. Lung cancer screening examination.

EXAM:
CT CHEST WITHOUT CONTRAST LOW-DOSE FOR LUNG CANCER SCREENING
TECHNIQUE: Multidetector CT imaging of the chest was performed following the
standard protocol without IV contrast.

[ct lung segmentation data · axial · 0.85mm/px · z∈[-382,-382]mm · 10 of 374 frames shown]
[frame 1/374  mediastinal]
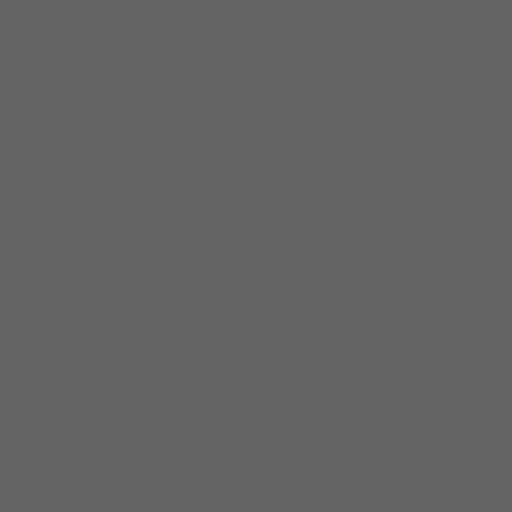
[frame 1/374  lung]
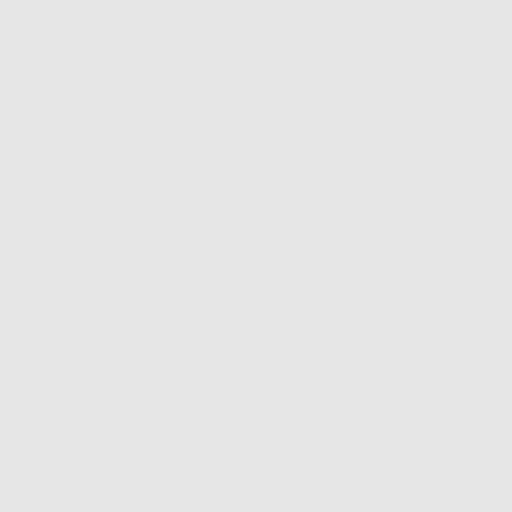
[frame 42/374  lung]
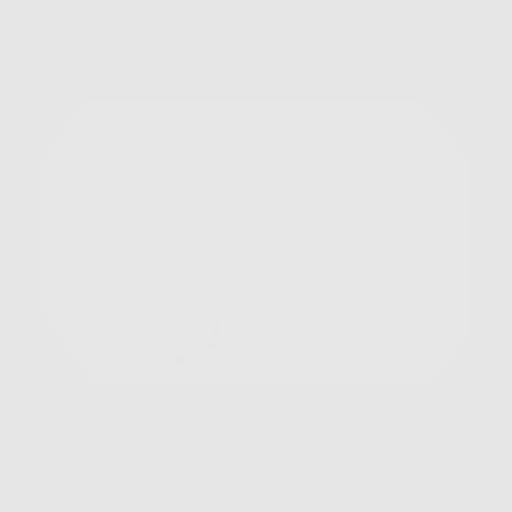
[frame 83/374  lung]
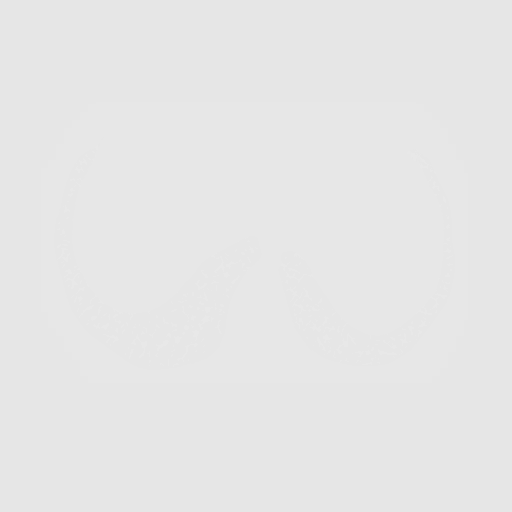
[frame 125/374  lung]
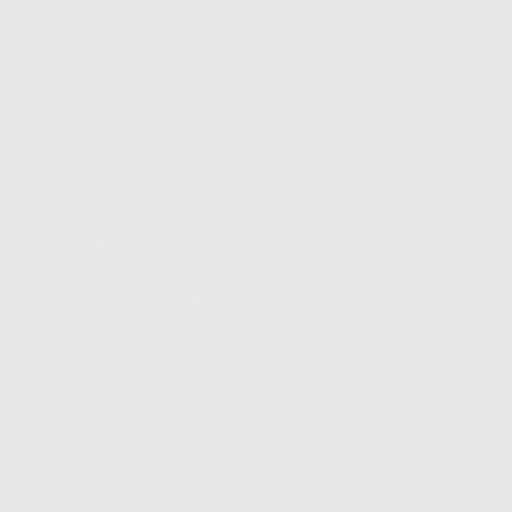
[frame 166/374  mediastinal]
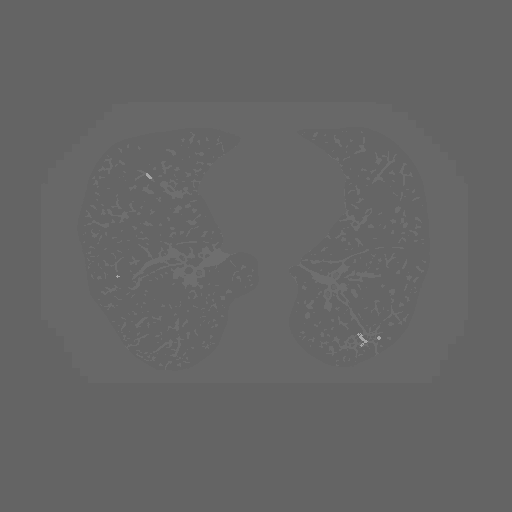
[frame 166/374  lung]
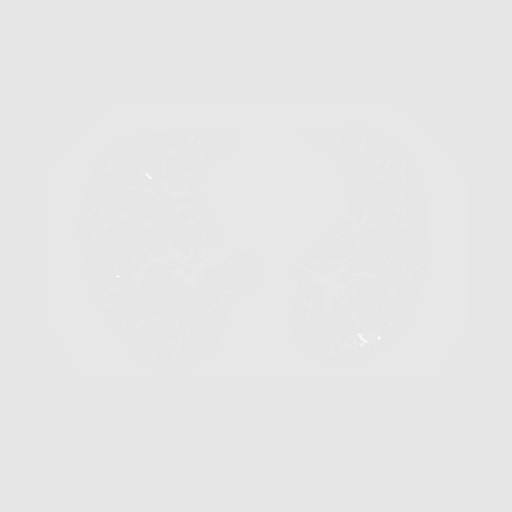
[frame 208/374  lung]
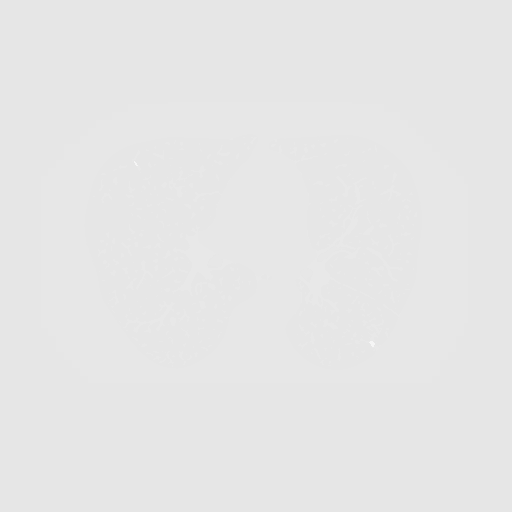
[frame 249/374  lung]
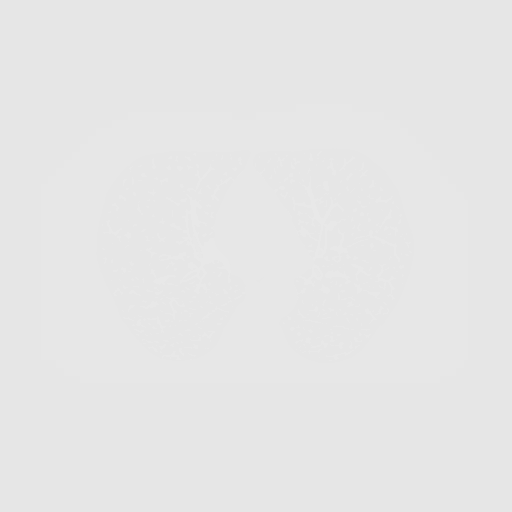
[frame 291/374  lung]
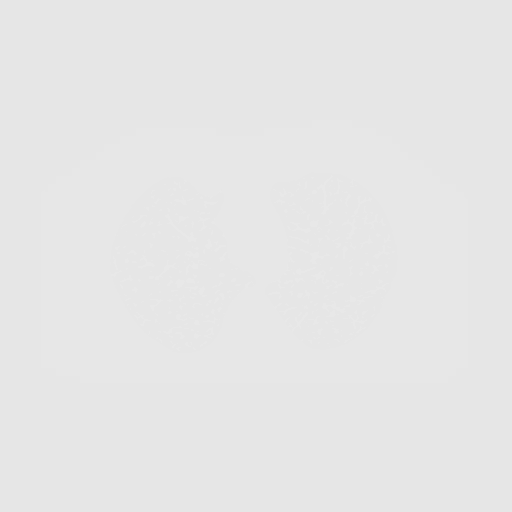
[frame 332/374  mediastinal]
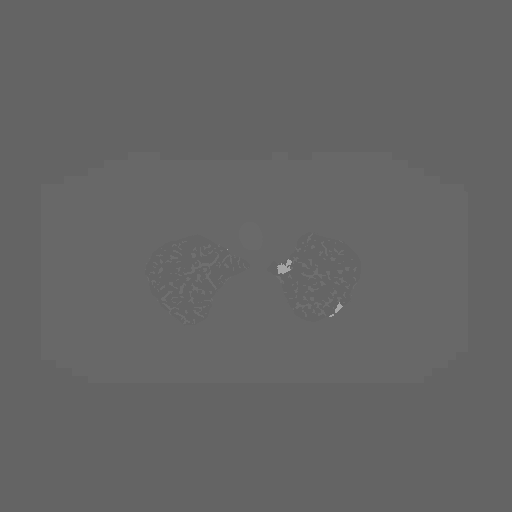
[frame 332/374  lung]
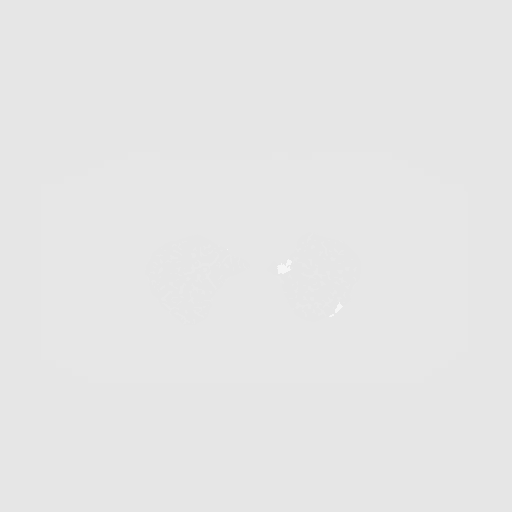
[frame 374/374  lung]
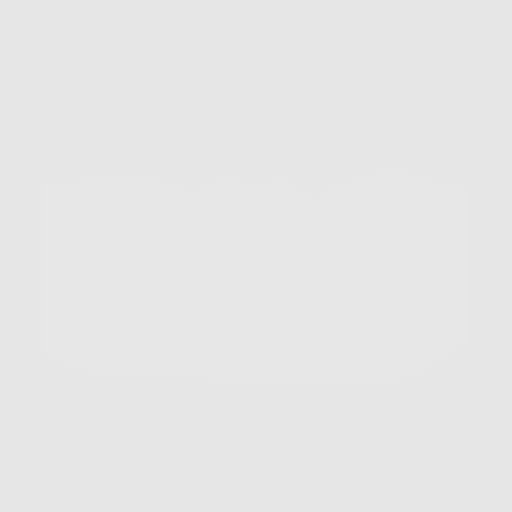

[10 of 10 positions shown; findings below may reference images not displayed]

FINDINGS: Cardiovascular: Heart size is normal. There is no significant
pericardial fluid, thickening or pericardial calcification. There is
aortic atherosclerosis, as well as atherosclerosis of the great
vessels of the mediastinum and the coronary arteries, including
calcified atherosclerotic plaque in the left main, left anterior
descending, left circumflex and right coronary arteries.

Mediastinum/Nodes: No pathologically enlarged mediastinal or hilar
lymph nodes. Please note that accurate exclusion of hilar adenopathy
is limited on noncontrast CT scans. Esophagus is unremarkable in
appearance. No axillary lymphadenopathy.

Lungs/Pleura: In addition to several tiny pulmonary nodules
scattered throughout the lungs bilaterally, there is a slowly
enlarging area of ground-glass attenuation, septal thickening,
internal cystic change and nodularity in the posterior aspect of the
left lower lobe which has minimally progressed over prior
examinations dating back to 4433, but now demonstrates some internal
solid components. At this time, this lesion overall measures 29.9 mm
in volume derived mean diameter, with a central solid component
measuring up to 5 mm (axial image 197 of series 3). No acute
consolidative airspace disease. No pleural effusions. Diffuse
bronchial wall thickening with mild centrilobular and paraseptal
emphysema.

Upper Abdomen: Unremarkable.

Musculoskeletal: There are no aggressive appearing lytic or blastic
lesions noted in the visualized portions of the skeleton.
IMPRESSION: 1. Slow progressive enlargement of a mixed sub solid and solid
lesion in the posterior aspect of the left lower lobe, highly
concerning for possible primary bronchogenic adenocarcinoma,
categorized as Lung-RADS 4BS, suspicious. Additional imaging
evaluation or consultation with Pulmonology or Thoracic Surgery
recommended.
2. The "S" modifier above refers to potentially clinically
significant non lung cancer related findings. Specifically, there is
aortic atherosclerosis, in addition to left main and 3 vessel
coronary artery disease. Please note that although the presence of
coronary artery calcium documents the presence of coronary artery
disease, the severity of this disease and any potential stenosis
cannot be assessed on this non-gated CT examination. Assessment for
potential risk factor modification, dietary therapy or pharmacologic
therapy may be warranted, if clinically indicated.
3. Mild diffuse bronchial wall thickening with mild centrilobular
and paraseptal emphysema; imaging findings suggestive of underlying
COPD.

These results will be called to the ordering clinician or
representative by the Radiologist Assistant, and communication
documented in the PACS or [REDACTED].

Aortic Atherosclerosis (R8DNH-SPX.X) and Emphysema (R8DNH-TMQ.H).

## 2023-06-25 DIAGNOSIS — Z6828 Body mass index (BMI) 28.0-28.9, adult: Secondary | ICD-10-CM | POA: Diagnosis not present

## 2023-06-25 DIAGNOSIS — Z1211 Encounter for screening for malignant neoplasm of colon: Secondary | ICD-10-CM | POA: Diagnosis not present

## 2023-06-25 DIAGNOSIS — Z0000 Encounter for general adult medical examination without abnormal findings: Secondary | ICD-10-CM | POA: Diagnosis not present

## 2023-06-25 DIAGNOSIS — Z1331 Encounter for screening for depression: Secondary | ICD-10-CM | POA: Diagnosis not present

## 2023-06-26 DIAGNOSIS — C3430 Malignant neoplasm of lower lobe, unspecified bronchus or lung: Secondary | ICD-10-CM | POA: Diagnosis not present

## 2023-06-26 DIAGNOSIS — E781 Pure hyperglyceridemia: Secondary | ICD-10-CM | POA: Diagnosis not present

## 2023-06-30 ENCOUNTER — Inpatient Hospital Stay
Admission: RE | Admit: 2023-06-30 | Discharge: 2023-06-30 | Disposition: A | Discharge status: Home / Self Care | Payer: Medicare HMO | Source: Ambulatory Visit | Attending: Acute Care | Admitting: Acute Care

## 2023-06-30 DIAGNOSIS — Z87891 Personal history of nicotine dependence: Secondary | ICD-10-CM

## 2023-06-30 DIAGNOSIS — Z122 Encounter for screening for malignant neoplasm of respiratory organs: Secondary | ICD-10-CM

## 2023-07-07 ENCOUNTER — Ambulatory Visit: Payer: Medicare HMO | Admitting: Thoracic Surgery (Cardiothoracic Vascular Surgery)

## 2023-07-07 ENCOUNTER — Encounter: Payer: Self-pay | Admitting: Thoracic Surgery (Cardiothoracic Vascular Surgery)

## 2023-07-07 VITALS — BP 123/83 | HR 95 | Resp 20 | Ht 76.0 in | Wt 228.0 lb

## 2023-07-07 DIAGNOSIS — Z902 Acquired absence of lung [part of]: Secondary | ICD-10-CM | POA: Diagnosis not present

## 2023-07-07 DIAGNOSIS — C3432 Malignant neoplasm of lower lobe, left bronchus or lung: Secondary | ICD-10-CM

## 2023-07-07 MED ORDER — PREGABALIN 50 MG PO CAPS: 50.0000 mg | ORAL_CAPSULE | Freq: Every day | ORAL | 5 refills | Status: DC

## 2023-07-07 NOTE — Progress Notes (Signed)
[address]       [place] [address]             [phone]     HPI: Isaac Carrillo returns for annual follow-up.  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  He underwent a robotic left lower lobectomy in March 2022 for left lower lobe lung mass.  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  Intercostal neuralgia postoperatively.  Gabapentin was not effective.  Lyrica was more effective but he wanted to use it on a as needed basis.  Still has bouts of pain that are unpredictable.  Not related to time of day, position, or exertion.  Complains of bulging when he sits up from a supine position.  Past Medical History:  Diagnosis Date   Anxiety    Centrilobular emphysema ([facility])    Pt denies 10/31/20   COPD (chronic obstructive pulmonary disease) ([facility])    Pt denies 10/31/20   Former smoker    History of kidney stones     x2 passed     Current Outpatient Medications  Medication Sig Dispense Refill   atorvastatin (LIPITOR) 20 MG tablet Take 20 mg by mouth daily.     ibuprofen (ADVIL,MOTRIN) 200 MG tablet Take 800 mg by mouth every 8 (eight) hours as needed for mild pain.     sildenafil (REVATIO) 20 MG tablet Take 40-80 mg by mouth daily as needed (erectile dysfunction.).     pregabalin (LYRICA) 50 MG capsule Take 1 capsule (50 mg total) by mouth daily. 30 capsule 5   No current facility-administered medications for this visit.    Physical Exam BP 123/83 (BP Location: Right Arm, Patient Position: Sitting, Cuff Size: Normal)   Pulse 95   Resp 20   Ht 6\' 4"  (1.93 m)   Wt 228 lb (103.4 kg)   SpO2 97% Comment: RA  BMI 27.64 kg/m  72 year old man in no acute distress Alert and oriented x 3 with no focal deficits Lungs slightly  diminished at left base but otherwise clear Cardiac regular rate and rhythm Abdomen Central bulge with no discrete hernia  Diagnostic Tests: CT-official read pending. I personally reviewed the CT images.  There is mild coronary calcification and aortic atherosclerosis.  Postoperative changes.  No suspicious nodules.  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  Ciliated mucoid papillary tumor in the lung-resected 3 years ago.  Benign lesion.  Tobacco abuse-quit smoking in 2017.  Needs continued low-dose CT screening on an annual basis.  Intercostal neuralgia-discussed use of Lyrica and that should be on a scheduled basis.  He would like to go back on that medication so I prescribed 50 mg daily 30 tablets with 5 refills.  If he wants to stay on it after 6 months he can call and we will refill that for him.  Abdominal bulging-does have some atrophy of the left upper quadrant abdominal musculature.  Probably also has some diastases recti.  No discrete hernia.  Reassured him that this is not dangerous in any way.  Plan: Lyrica as above Return in 1 year with low-dose CT for lung cancer screening  Salvatore Decent  Dorris Fetch, MD [facility] [phone]

## 2023-07-26 DIAGNOSIS — Z1212 Encounter for screening for malignant neoplasm of rectum: Secondary | ICD-10-CM | POA: Diagnosis not present

## 2023-07-26 DIAGNOSIS — Z1211 Encounter for screening for malignant neoplasm of colon: Secondary | ICD-10-CM | POA: Diagnosis not present

## 2023-08-03 ENCOUNTER — Other Ambulatory Visit: Payer: Self-pay

## 2023-08-03 DIAGNOSIS — Z87891 Personal history of nicotine dependence: Secondary | ICD-10-CM

## 2023-08-03 DIAGNOSIS — Z122 Encounter for screening for malignant neoplasm of respiratory organs: Secondary | ICD-10-CM

## 2023-08-24 ENCOUNTER — Other Ambulatory Visit: Payer: Self-pay | Admitting: Thoracic Surgery (Cardiothoracic Vascular Surgery)

## 2023-08-24 MED ORDER — PREGABALIN 50 MG PO CAPS: 50.0000 mg | ORAL_CAPSULE | Freq: Every day | ORAL | 5 refills | Status: AC

## 2023-08-24 NOTE — Progress Notes (Signed)
Reordered Lyrica 50 mg daily, 30 tablets, 5 refills  Julien Berryman C. Dorris Fetch, MD [facility] [phone]

## 2023-09-15 DIAGNOSIS — Z85118 Personal history of other malignant neoplasm of bronchus and lung: Secondary | ICD-10-CM | POA: Diagnosis not present

## 2023-09-15 DIAGNOSIS — R195 Other fecal abnormalities: Secondary | ICD-10-CM | POA: Diagnosis not present

## 2023-09-23 DIAGNOSIS — Z01818 Encounter for other preprocedural examination: Secondary | ICD-10-CM | POA: Diagnosis not present

## 2023-09-23 DIAGNOSIS — L219 Seborrheic dermatitis, unspecified: Secondary | ICD-10-CM | POA: Diagnosis not present

## 2023-09-23 DIAGNOSIS — I2510 Atherosclerotic heart disease of native coronary artery without angina pectoris: Secondary | ICD-10-CM | POA: Diagnosis not present

## 2023-09-23 DIAGNOSIS — R350 Frequency of micturition: Secondary | ICD-10-CM | POA: Diagnosis not present

## 2023-10-05 DIAGNOSIS — K5730 Diverticulosis of large intestine without perforation or abscess without bleeding: Secondary | ICD-10-CM | POA: Diagnosis not present

## 2023-10-05 DIAGNOSIS — R195 Other fecal abnormalities: Secondary | ICD-10-CM | POA: Diagnosis not present

## 2023-10-05 DIAGNOSIS — K635 Polyp of colon: Secondary | ICD-10-CM | POA: Diagnosis not present

## 2023-10-05 DIAGNOSIS — D122 Benign neoplasm of ascending colon: Secondary | ICD-10-CM | POA: Diagnosis not present

## 2023-10-05 DIAGNOSIS — D124 Benign neoplasm of descending colon: Secondary | ICD-10-CM | POA: Diagnosis not present

## 2023-10-05 DIAGNOSIS — Z1211 Encounter for screening for malignant neoplasm of colon: Secondary | ICD-10-CM | POA: Diagnosis not present

## 2023-10-05 DIAGNOSIS — K648 Other hemorrhoids: Secondary | ICD-10-CM | POA: Diagnosis not present

## 2023-10-05 DIAGNOSIS — D123 Benign neoplasm of transverse colon: Secondary | ICD-10-CM | POA: Diagnosis not present

## 2023-10-05 DIAGNOSIS — D125 Benign neoplasm of sigmoid colon: Secondary | ICD-10-CM | POA: Diagnosis not present

## 2023-10-07 DIAGNOSIS — D124 Benign neoplasm of descending colon: Secondary | ICD-10-CM | POA: Diagnosis not present

## 2023-10-07 DIAGNOSIS — K635 Polyp of colon: Secondary | ICD-10-CM | POA: Diagnosis not present

## 2023-10-07 DIAGNOSIS — D123 Benign neoplasm of transverse colon: Secondary | ICD-10-CM | POA: Diagnosis not present

## 2023-10-07 DIAGNOSIS — D122 Benign neoplasm of ascending colon: Secondary | ICD-10-CM | POA: Diagnosis not present

## 2023-10-07 DIAGNOSIS — D125 Benign neoplasm of sigmoid colon: Secondary | ICD-10-CM | POA: Diagnosis not present

## 2023-10-20 DIAGNOSIS — K649 Unspecified hemorrhoids: Secondary | ICD-10-CM | POA: Diagnosis not present

## 2023-10-20 DIAGNOSIS — K5900 Constipation, unspecified: Secondary | ICD-10-CM | POA: Diagnosis not present

## 2023-12-24 DIAGNOSIS — L718 Other rosacea: Secondary | ICD-10-CM | POA: Diagnosis not present

## 2024-01-13 DIAGNOSIS — L718 Other rosacea: Secondary | ICD-10-CM | POA: Diagnosis not present

## 2024-02-03 DIAGNOSIS — Z833 Family history of diabetes mellitus: Secondary | ICD-10-CM | POA: Diagnosis not present

## 2024-02-03 DIAGNOSIS — I2510 Atherosclerotic heart disease of native coronary artery without angina pectoris: Secondary | ICD-10-CM | POA: Diagnosis not present

## 2024-02-03 DIAGNOSIS — Z8249 Family history of ischemic heart disease and other diseases of the circulatory system: Secondary | ICD-10-CM | POA: Diagnosis not present

## 2024-02-03 DIAGNOSIS — Z791 Long term (current) use of non-steroidal anti-inflammatories (NSAID): Secondary | ICD-10-CM | POA: Diagnosis not present

## 2024-02-03 DIAGNOSIS — R32 Unspecified urinary incontinence: Secondary | ICD-10-CM | POA: Diagnosis not present

## 2024-02-03 DIAGNOSIS — E785 Hyperlipidemia, unspecified: Secondary | ICD-10-CM | POA: Diagnosis not present

## 2024-02-03 DIAGNOSIS — Z87891 Personal history of nicotine dependence: Secondary | ICD-10-CM | POA: Diagnosis not present

## 2024-02-03 DIAGNOSIS — F419 Anxiety disorder, unspecified: Secondary | ICD-10-CM | POA: Diagnosis not present

## 2024-02-03 DIAGNOSIS — F325 Major depressive disorder, single episode, in full remission: Secondary | ICD-10-CM | POA: Diagnosis not present

## 2024-02-03 DIAGNOSIS — Z008 Encounter for other general examination: Secondary | ICD-10-CM | POA: Diagnosis not present

## 2024-02-03 DIAGNOSIS — N400 Enlarged prostate without lower urinary tract symptoms: Secondary | ICD-10-CM | POA: Diagnosis not present

## 2024-02-03 DIAGNOSIS — Z823 Family history of stroke: Secondary | ICD-10-CM | POA: Diagnosis not present

## 2024-02-03 DIAGNOSIS — R030 Elevated blood-pressure reading, without diagnosis of hypertension: Secondary | ICD-10-CM | POA: Diagnosis not present

## 2024-02-16 DIAGNOSIS — H5203 Hypermetropia, bilateral: Secondary | ICD-10-CM | POA: Diagnosis not present

## 2024-02-16 DIAGNOSIS — H52223 Regular astigmatism, bilateral: Secondary | ICD-10-CM | POA: Diagnosis not present

## 2024-02-16 DIAGNOSIS — H524 Presbyopia: Secondary | ICD-10-CM | POA: Diagnosis not present

## 2024-03-14 DIAGNOSIS — L718 Other rosacea: Secondary | ICD-10-CM | POA: Diagnosis not present

## 2024-03-15 DIAGNOSIS — H5203 Hypermetropia, bilateral: Secondary | ICD-10-CM | POA: Diagnosis not present

## 2024-03-15 DIAGNOSIS — H52223 Regular astigmatism, bilateral: Secondary | ICD-10-CM | POA: Diagnosis not present

## 2024-03-31 DIAGNOSIS — C3430 Malignant neoplasm of lower lobe, unspecified bronchus or lung: Secondary | ICD-10-CM | POA: Diagnosis not present

## 2024-03-31 DIAGNOSIS — E781 Pure hyperglyceridemia: Secondary | ICD-10-CM | POA: Diagnosis not present

## 2024-03-31 DIAGNOSIS — I2510 Atherosclerotic heart disease of native coronary artery without angina pectoris: Secondary | ICD-10-CM | POA: Diagnosis not present

## 2024-04-26 DIAGNOSIS — M792 Neuralgia and neuritis, unspecified: Secondary | ICD-10-CM | POA: Diagnosis not present

## 2024-04-26 DIAGNOSIS — R0981 Nasal congestion: Secondary | ICD-10-CM | POA: Diagnosis not present

## 2024-04-26 DIAGNOSIS — I700 Atherosclerosis of aorta: Secondary | ICD-10-CM | POA: Diagnosis not present

## 2024-04-26 DIAGNOSIS — Z6828 Body mass index (BMI) 28.0-28.9, adult: Secondary | ICD-10-CM | POA: Diagnosis not present

## 2024-04-26 DIAGNOSIS — I2510 Atherosclerotic heart disease of native coronary artery without angina pectoris: Secondary | ICD-10-CM | POA: Diagnosis not present

## 2024-04-26 DIAGNOSIS — R5383 Other fatigue: Secondary | ICD-10-CM | POA: Diagnosis not present

## 2024-05-01 DIAGNOSIS — C3430 Malignant neoplasm of lower lobe, unspecified bronchus or lung: Secondary | ICD-10-CM | POA: Diagnosis not present

## 2024-05-01 DIAGNOSIS — I2510 Atherosclerotic heart disease of native coronary artery without angina pectoris: Secondary | ICD-10-CM | POA: Diagnosis not present

## 2024-05-01 DIAGNOSIS — E781 Pure hyperglyceridemia: Secondary | ICD-10-CM | POA: Diagnosis not present

## 2024-06-16 ENCOUNTER — Other Ambulatory Visit: Payer: Self-pay | Admitting: Thoracic Surgery (Cardiothoracic Vascular Surgery)

## 2024-06-16 DIAGNOSIS — C3432 Malignant neoplasm of lower lobe, left bronchus or lung: Secondary | ICD-10-CM

## 2024-06-29 DIAGNOSIS — I2510 Atherosclerotic heart disease of native coronary artery without angina pectoris: Secondary | ICD-10-CM | POA: Diagnosis not present

## 2024-06-29 DIAGNOSIS — Z125 Encounter for screening for malignant neoplasm of prostate: Secondary | ICD-10-CM | POA: Diagnosis not present

## 2024-06-29 DIAGNOSIS — R7401 Elevation of levels of liver transaminase levels: Secondary | ICD-10-CM | POA: Diagnosis not present

## 2024-07-01 DIAGNOSIS — C3430 Malignant neoplasm of lower lobe, unspecified bronchus or lung: Secondary | ICD-10-CM | POA: Diagnosis not present

## 2024-07-01 DIAGNOSIS — E781 Pure hyperglyceridemia: Secondary | ICD-10-CM | POA: Diagnosis not present

## 2024-07-01 DIAGNOSIS — I2510 Atherosclerotic heart disease of native coronary artery without angina pectoris: Secondary | ICD-10-CM | POA: Diagnosis not present

## 2024-07-04 DIAGNOSIS — R300 Dysuria: Secondary | ICD-10-CM | POA: Diagnosis not present

## 2024-07-04 DIAGNOSIS — E875 Hyperkalemia: Secondary | ICD-10-CM | POA: Diagnosis not present

## 2024-07-05 ENCOUNTER — Ambulatory Visit (HOSPITAL_COMMUNITY)

## 2024-07-07 ENCOUNTER — Ambulatory Visit (HOSPITAL_COMMUNITY)
Admission: RE | Admit: 2024-07-07 | Discharge: 2024-07-07 | Disposition: A | Discharge status: Home / Self Care | Source: Ambulatory Visit | Attending: Thoracic Surgery (Cardiothoracic Vascular Surgery) | Admitting: Thoracic Surgery (Cardiothoracic Vascular Surgery)

## 2024-07-07 DIAGNOSIS — C3432 Malignant neoplasm of lower lobe, left bronchus or lung: Secondary | ICD-10-CM | POA: Diagnosis not present

## 2024-07-07 DIAGNOSIS — J432 Centrilobular emphysema: Secondary | ICD-10-CM | POA: Diagnosis not present

## 2024-07-07 DIAGNOSIS — I700 Atherosclerosis of aorta: Secondary | ICD-10-CM | POA: Diagnosis not present

## 2024-07-08 ENCOUNTER — Other Ambulatory Visit (HOSPITAL_BASED_OUTPATIENT_CLINIC_OR_DEPARTMENT_OTHER): Payer: Self-pay | Admitting: Family Medicine

## 2024-07-08 ENCOUNTER — Ambulatory Visit (HOSPITAL_BASED_OUTPATIENT_CLINIC_OR_DEPARTMENT_OTHER)
Admission: RE | Admit: 2024-07-08 | Discharge: 2024-07-08 | Disposition: A | Discharge status: Home / Self Care | Source: Ambulatory Visit | Attending: Family Medicine | Admitting: Family Medicine

## 2024-07-08 DIAGNOSIS — R310 Gross hematuria: Secondary | ICD-10-CM | POA: Insufficient documentation

## 2024-07-08 DIAGNOSIS — R3989 Other symptoms and signs involving the genitourinary system: Secondary | ICD-10-CM | POA: Diagnosis not present

## 2024-07-08 DIAGNOSIS — N400 Enlarged prostate without lower urinary tract symptoms: Secondary | ICD-10-CM | POA: Diagnosis not present

## 2024-07-08 DIAGNOSIS — K4090 Unilateral inguinal hernia, without obstruction or gangrene, not specified as recurrent: Secondary | ICD-10-CM | POA: Diagnosis not present

## 2024-07-11 ENCOUNTER — Other Ambulatory Visit (HOSPITAL_COMMUNITY): Payer: Self-pay | Admitting: Family Medicine

## 2024-07-11 DIAGNOSIS — N2889 Other specified disorders of kidney and ureter: Secondary | ICD-10-CM

## 2024-07-12 ENCOUNTER — Ambulatory Visit (HOSPITAL_COMMUNITY)
Admission: RE | Admit: 2024-07-12 | Discharge: 2024-07-12 | Disposition: A | Discharge status: Home / Self Care | Source: Ambulatory Visit | Attending: Family Medicine | Admitting: Family Medicine

## 2024-07-12 ENCOUNTER — Ambulatory Visit

## 2024-07-12 DIAGNOSIS — N2889 Other specified disorders of kidney and ureter: Secondary | ICD-10-CM | POA: Insufficient documentation

## 2024-07-12 DIAGNOSIS — N289 Disorder of kidney and ureter, unspecified: Secondary | ICD-10-CM | POA: Diagnosis not present

## 2024-07-12 DIAGNOSIS — N281 Cyst of kidney, acquired: Secondary | ICD-10-CM | POA: Diagnosis not present

## 2024-07-12 DIAGNOSIS — C3490 Malignant neoplasm of unspecified part of unspecified bronchus or lung: Secondary | ICD-10-CM | POA: Diagnosis not present

## 2024-07-12 MED ORDER — GADOBUTROL 1 MMOL/ML IV SOLN
10.0000 mL | Freq: Once | INTRAVENOUS | Status: AC | PRN
  Administered 2024-07-12: 10 mL via INTRAVENOUS
